# Patient Record
Sex: Female | Born: 1985 | Race: White | Hispanic: No | Marital: Married | Smoking: Current every day smoker
Health system: Southern US, Community
[De-identification: ages and names within clinical notes are randomized; demographics above are authoritative.]

## PROBLEM LIST (undated history)

## (undated) ENCOUNTER — Inpatient Hospital Stay (HOSPITAL_COMMUNITY): Payer: Self-pay

## (undated) DIAGNOSIS — F329 Major depressive disorder, single episode, unspecified: Secondary | ICD-10-CM

## (undated) DIAGNOSIS — F32A Depression, unspecified: Secondary | ICD-10-CM

## (undated) DIAGNOSIS — I493 Ventricular premature depolarization: Secondary | ICD-10-CM

## (undated) DIAGNOSIS — R87619 Unspecified abnormal cytological findings in specimens from cervix uteri: Secondary | ICD-10-CM

## (undated) DIAGNOSIS — E119 Type 2 diabetes mellitus without complications: Secondary | ICD-10-CM

## (undated) DIAGNOSIS — F419 Anxiety disorder, unspecified: Secondary | ICD-10-CM

## (undated) DIAGNOSIS — A549 Gonococcal infection, unspecified: Secondary | ICD-10-CM

## (undated) DIAGNOSIS — K219 Gastro-esophageal reflux disease without esophagitis: Secondary | ICD-10-CM

## (undated) DIAGNOSIS — G43909 Migraine, unspecified, not intractable, without status migrainosus: Secondary | ICD-10-CM

## (undated) DIAGNOSIS — B977 Papillomavirus as the cause of diseases classified elsewhere: Secondary | ICD-10-CM

## (undated) DIAGNOSIS — Z8619 Personal history of other infectious and parasitic diseases: Secondary | ICD-10-CM

## (undated) DIAGNOSIS — IMO0002 Reserved for concepts with insufficient information to code with codable children: Secondary | ICD-10-CM

## (undated) DIAGNOSIS — R51 Headache: Secondary | ICD-10-CM

## (undated) HISTORY — PX: WISDOM TOOTH EXTRACTION: SHX21

## (undated) HISTORY — DX: Personal history of other infectious and parasitic diseases: Z86.19

## (undated) HISTORY — DX: Migraine, unspecified, not intractable, without status migrainosus: G43.909

## (undated) HISTORY — PX: TUBAL LIGATION: SHX77

---
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
## 2006-01-04 ENCOUNTER — Emergency Department (HOSPITAL_COMMUNITY): Admission: EM | Admit: 2006-01-04 | Discharge: 2006-01-04 | Payer: Self-pay | Admitting: Emergency Medicine

## 2006-08-04 ENCOUNTER — Inpatient Hospital Stay (HOSPITAL_COMMUNITY): Admission: AD | Admit: 2006-08-04 | Discharge: 2006-08-04 | Payer: Self-pay | Admitting: Gynecology

## 2006-11-03 ENCOUNTER — Inpatient Hospital Stay (HOSPITAL_COMMUNITY): Admission: AD | Admit: 2006-11-03 | Discharge: 2006-11-03 | Payer: Self-pay | Admitting: Obstetrics and Gynecology

## 2007-01-19 ENCOUNTER — Inpatient Hospital Stay (HOSPITAL_COMMUNITY): Admission: AD | Admit: 2007-01-19 | Discharge: 2007-01-19 | Payer: Self-pay | Admitting: Obstetrics & Gynecology

## 2007-03-27 ENCOUNTER — Inpatient Hospital Stay (HOSPITAL_COMMUNITY): Admission: AD | Admit: 2007-03-27 | Discharge: 2007-03-28 | Payer: Self-pay | Admitting: Obstetrics & Gynecology

## 2007-03-28 ENCOUNTER — Inpatient Hospital Stay (HOSPITAL_COMMUNITY): Admission: AD | Admit: 2007-03-28 | Discharge: 2007-04-01 | Payer: Self-pay | Admitting: Obstetrics and Gynecology

## 2007-03-29 ENCOUNTER — Encounter (INDEPENDENT_AMBULATORY_CARE_PROVIDER_SITE_OTHER): Payer: Self-pay | Admitting: Obstetrics and Gynecology

## 2007-05-18 ENCOUNTER — Emergency Department (HOSPITAL_COMMUNITY): Admission: EM | Admit: 2007-05-18 | Discharge: 2007-05-19 | Payer: Self-pay | Admitting: Emergency Medicine

## 2007-11-03 ENCOUNTER — Inpatient Hospital Stay (HOSPITAL_COMMUNITY): Admission: AD | Admit: 2007-11-03 | Discharge: 2007-11-04 | Payer: Self-pay | Admitting: Obstetrics and Gynecology

## 2007-11-03 ENCOUNTER — Emergency Department (HOSPITAL_COMMUNITY): Admission: EM | Admit: 2007-11-03 | Discharge: 2007-11-03 | Payer: Self-pay | Admitting: Emergency Medicine

## 2007-12-07 ENCOUNTER — Inpatient Hospital Stay (HOSPITAL_COMMUNITY): Admission: AD | Admit: 2007-12-07 | Discharge: 2007-12-07 | Payer: Self-pay | Admitting: Obstetrics & Gynecology

## 2008-02-12 ENCOUNTER — Inpatient Hospital Stay (HOSPITAL_COMMUNITY): Admission: AD | Admit: 2008-02-12 | Discharge: 2008-02-12 | Payer: Self-pay | Admitting: Obstetrics & Gynecology

## 2008-02-28 ENCOUNTER — Inpatient Hospital Stay (HOSPITAL_COMMUNITY): Admission: RE | Admit: 2008-02-28 | Discharge: 2008-03-02 | Payer: Self-pay | Admitting: Obstetrics and Gynecology

## 2008-03-03 ENCOUNTER — Inpatient Hospital Stay (HOSPITAL_COMMUNITY): Admission: AD | Admit: 2008-03-03 | Discharge: 2008-03-03 | Payer: Self-pay | Admitting: Obstetrics and Gynecology

## 2008-04-11 ENCOUNTER — Emergency Department (HOSPITAL_COMMUNITY): Admission: EM | Admit: 2008-04-11 | Discharge: 2008-04-11 | Payer: Self-pay | Admitting: Emergency Medicine

## 2008-05-07 ENCOUNTER — Emergency Department (HOSPITAL_COMMUNITY): Admission: EM | Admit: 2008-05-07 | Discharge: 2008-05-08 | Payer: Self-pay | Admitting: Emergency Medicine

## 2008-05-27 ENCOUNTER — Emergency Department (HOSPITAL_COMMUNITY): Admission: EM | Admit: 2008-05-27 | Discharge: 2008-05-27 | Payer: Self-pay | Admitting: Emergency Medicine

## 2008-10-05 ENCOUNTER — Emergency Department (HOSPITAL_COMMUNITY): Admission: EM | Admit: 2008-10-05 | Discharge: 2008-10-05 | Payer: Self-pay | Admitting: Emergency Medicine

## 2008-12-21 ENCOUNTER — Emergency Department (HOSPITAL_COMMUNITY): Admission: EM | Admit: 2008-12-21 | Discharge: 2008-12-22 | Payer: Self-pay | Admitting: Emergency Medicine

## 2009-02-22 ENCOUNTER — Emergency Department (HOSPITAL_COMMUNITY): Admission: EM | Admit: 2009-02-22 | Discharge: 2009-02-23 | Payer: Self-pay | Admitting: Emergency Medicine

## 2009-03-12 ENCOUNTER — Emergency Department (HOSPITAL_COMMUNITY): Admission: EM | Admit: 2009-03-12 | Discharge: 2009-03-12 | Payer: Self-pay | Admitting: Emergency Medicine

## 2009-06-06 ENCOUNTER — Emergency Department (HOSPITAL_COMMUNITY): Admission: EM | Admit: 2009-06-06 | Discharge: 2009-06-06 | Payer: Self-pay | Admitting: Emergency Medicine

## 2010-04-22 ENCOUNTER — Emergency Department (HOSPITAL_COMMUNITY)
Admission: EM | Admit: 2010-04-22 | Discharge: 2010-04-22 | Payer: Self-pay | Source: Home / Self Care | Admitting: Emergency Medicine

## 2010-11-09 LAB — URINALYSIS, ROUTINE W REFLEX MICROSCOPIC
Bilirubin Urine: NEGATIVE
Glucose, UA: NEGATIVE mg/dL
Ketones, ur: NEGATIVE mg/dL
Leukocytes, UA: NEGATIVE
Nitrite: NEGATIVE
Protein, ur: NEGATIVE mg/dL
Specific Gravity, Urine: 1.014 (ref 1.005–1.030)
Urobilinogen, UA: 0.2 mg/dL (ref 0.0–1.0)
pH: 5.5 (ref 5.0–8.0)

## 2010-11-09 LAB — COMPREHENSIVE METABOLIC PANEL
Albumin: 3.5 g/dL (ref 3.5–5.2)
Alkaline Phosphatase: 56 U/L (ref 39–117)
BUN: 8 mg/dL (ref 6–23)
Calcium: 9.1 mg/dL (ref 8.4–10.5)
Creatinine, Ser: 0.62 mg/dL (ref 0.4–1.2)
Potassium: 3.9 mEq/L (ref 3.5–5.1)
Total Protein: 6.8 g/dL (ref 6.0–8.3)

## 2010-11-09 LAB — URINE MICROSCOPIC-ADD ON

## 2010-11-09 LAB — CBC
HCT: 38.9 % (ref 36.0–46.0)
Platelets: 259 10*3/uL (ref 150–400)
RDW: 12.5 % (ref 11.5–15.5)

## 2010-11-09 LAB — DIFFERENTIAL
Lymphocytes Relative: 38 % (ref 12–46)
Lymphs Abs: 2.6 10*3/uL (ref 0.7–4.0)
Monocytes Absolute: 0.6 10*3/uL (ref 0.1–1.0)
Monocytes Relative: 9 % (ref 3–12)
Neutro Abs: 3.4 10*3/uL (ref 1.7–7.7)

## 2010-11-11 LAB — WOUND CULTURE

## 2010-12-16 NOTE — Op Note (Signed)
[name], [name]              ACCOUNT NO.:  [id number]   MEDICAL RECORD NO.:  [id number]          PATIENT TYPE:  INP   LOCATION:  9126                          FACILITY:  [facility]   PHYSICIAN:  Randye Lobo, M.D.   DATE OF BIRTH:  13-Feb-1986   DATE OF PROCEDURE:  03/29/2007  DATE OF DISCHARGE:                               OPERATIVE REPORT   PREOPERATIVE DIAGNOSIS:  1. Intrauterine gestation at 63 plus 6 weeks.  2. Severe fetal variable decelerations.  3. Group B strep positive status.  4. Tobacco use.   POSTOPERATIVE DIAGNOSIS:  1. Intrauterine gestation at 55 plus 6 weeks.  2. Severe fetal variable decelerations.  3. Positive group B strep status.  4. Tobacco use.   PROCEDURE:  Primary low segment transverse cesarean section.   SURGEON:  Conley Simmonds, M.D.   ANESTHESIA:  Spinal.   IV FLUIDS:  3000 mL Ringer's lactate.   ESTIMATED BLOOD LOSS:  700 mL   URINE OUTPUT:  650 mL   COMPLICATIONS:  None.   INDICATIONS FOR PROCEDURE:  The patient is a 25 year old gravida 1, para  24 Caucasian female at 24 plus [redacted] weeks gestation ([facility] January 28, 2007) who  presented to the maternity admissions with contractions since March 27, 2007.  The patient had been seen in maternity admissions on August 24 at  which time her cervix was 1 cm dilated.  The patient was discharged to  home.  The patient had follow-up in the office on March 28, 2007 at  which time her cervix was 3 cm dilated.  At that time her contractions  were approximately 11 minutes apart.  The patient was sent home with  labor precautions and she returned late in the evening on that same day  to maternity admissions at which time her cervix was noted to be 4 cm  dilated and 80-90% effaced.  The patient was admitted. There was a  question of possible variable decelerations.  The fetal heart rate  tracing did have accelerations and was reactive.  The patient was  admitted and given IV fluids.  The patient did request  Stadol for pain  control.  The fetal heart rate tracing continued to have what appeared  to be moderate variable decelerations although the contractions were not  monitoring well depending on the position of the patient.  I checked the  patient's cervix and found her cervix to be unchanged and I ruptured  membranes at this time and clear fluid was noted.  I placed both an  internal pressure catheter and fetal scalp electrode.  The fetal scalp  electrode was noted to have an incomplete tracing at times which  suggested the possibility of an arrhythmia.  The external Doppler was  therefore used to monitor the fetal heart rate as well.  When the  patient had good quality contractions that she was feeling, the fetus  continued to develop moderate to severe variable decelerations.  A  discussion was held with the patient at this time regarding the fetal  intolerance to labor and the variable decelerations and a plan was  made  to proceed with a cesarean section as the patient was remote from  delivery.  Risks, benefits, and alternatives were discussed with the  patient and her husband who chose to proceed.   FINDINGS:  A viable female was delivered at 6:06 a.m. with Apgars of 8 at  1 and 9 at 5 minutes.  The weight was 6 pounds 7 ounces.  The amniotic  fluid was noted to be clear.  The cord pH was 7.3.  The uterus, tubes  and ovaries were unremarkable.  The placenta had a notable number of  calcifications in the cotyledons.  The placental mass was noted to be  small.  There was a three-vessel cord.   SPECIMENS:  The placenta was sent to pathology.   PROCEDURE:  The patient was taken from her labor and delivery suite down  to the operating room where spinal anesthetic was administered.  The  patient was then placed in the supine position with a left lateral tilt.  The abdomen was sterilely prepped and draped.  A Foley catheter had been  previously placed inside the patient's bladder in her  labor and delivery  suite.   An Allis test was performed and the patient was noted to have adequate  surgical anesthesia.  A Pfannenstiel incision was then created sharply  with a scalpel.  The incision was carried down to the fascia using  monopolar cautery for hemostasis.  The fascia was then incised in the  midline with a scalpel and the incision was extended bilaterally with a  Mayo scissors.  The rectus muscles were separated from the fascia  superiorly and inferiorly using sharp dissection.  The rectus muscles  were then sharply divided in the midline.  The parietal peritoneum was  elevated with two hemostat clamps and was entered sharply.  The  peritoneal incision was extended cranially and caudally.   The lower uterine segment was exposed with the bladder retractor.  The  bladder flap was sharply created.  A transverse lower uterine segment  incision was then created with a scalpel.  The incision was extended  bilaterally in an upward fashion with the bandage scissors.  A hand was  inserted through the uterine incision and the vertex was delivered  without difficulty followed by the remainder of the infant.  The nares  and the mouth of the newborn were suctioned.  The newborn was carried  over to the waiting pediatricians.   A cord pH and cord blood were obtained at this time and the placenta was  then sent to pathology.   The uterus was exteriorized for its closure.  It was wiped clean with a  moistened lap pad.  The uterine incision was then closed with a double  layer closure of #1 chromic.  The first was a running locked layer and  the second was an imbricating layer.  There was some bleeding along the  midportion of the incision which was sutured with two figure-of-eight  sutures of #1 chromic.  At a suture exit site along the upper right  midportion of the incision there was some bleeding and this was  controlled with two figure-of-eight sutures of 3-0 Vicryl  suture.   The uterus was returned to the peritoneal cavity as it had been  exteriorized for its closure.  The peritoneal cavity was irrigated and  suctioned.   The uterine incision was found to be hemostatic and the abdomen was  therefore closed.  The peritoneal layer was closed  with a running suture  of 3-0 Vicryl.  The rectus muscles were reapproximated in the midline  with interrupted sutures of #1 chromic.  The fascia was closed with a  running suture of 0 Vicryl.  The subcutaneous layer was irrigated and  suctioned and made hemostatic with monopolar cautery.  The skin was  closed with staples and a sterile bandage was placed over this.   This concluded the patient's procedure.  There were no complications.  All needle, instrument, sponge counts were correct.      Randye Lobo, M.D.  Electronically Signed     BES/MEDQ  D:  03/29/2007  T:  03/29/2007  Job:  [id number]

## 2010-12-16 NOTE — Op Note (Signed)
[name], [name]              ACCOUNT NO.:  [id number]   MEDICAL RECORD NO.:  [id number]          PATIENT TYPE:  INP   LOCATION:  9109                          FACILITY:  [facility]   PHYSICIAN:  Randye Lobo, M.D.   DATE OF BIRTH:  29-Jun-1986   DATE OF PROCEDURE:  02/28/2008  DATE OF DISCHARGE:                               OPERATIVE REPORT   PREOPERATIVE DIAGNOSES:  1. Intrauterine gestation at 57 plus 2 weeks.  2. History of prior cesarean section, desires repeat cesarean section.   POSTOPERATIVE DIAGNOSES:  1. Intrauterine gestation at 41 plus 2 weeks.  2. History of prior cesarean section, desires repeat cesarean section.  3. Defect in myometrium of the left lower uterine segment.   PROCEDURE:  Repeat low-segment transverse cesarean section.   SURGEON:  Randye Lobo, MD.   ASSISTANTGretchen Short, PA-C   ANESTHESIA:  Spinal.   IV FLUIDS:  2600 mL Ringer lactate.   ESTIMATED BLOOD LOSS:  700 mL.   URINE OUTPUT:  300 mL   COMPLICATIONS:  None.   INDICATIONS FOR THE PROCEDURE:  The patient is a 25 year old gravida 2,  para 1-0-0-1 Caucasian female at 3 plus 2 weeks' gestation by early  ultrasound, with a history of prior cesarean section due to a non-  reassuring fetal assessment, and throughout her current pregnancy has  desired a repeat cesarean section and the avoidance of labor.  The  patient's antepartum course has been significant for a smoking status,  situational depression treated with Lexapro, and a history of cervical  dysplasia which was diagnosed immediately prior to this pregnancy.  A  plan is now made to proceed with a repeat cesarean section after term,  after risks, benefits, and alternatives have reviewed.   FINDINGS:  A viable female was delivered at 8:52 a.m.Marland Kitchen  Apgars were 7 at  1 minute and 8 at 5 minutes.  The weight was 7 pounds 5 ounces.  The  amniotic fluid was noted to be clear.  The newborn did require blow-by  O2 due to grunting.   The LOWER UTERINE SEGMENT was noted to be paper thin, and the LEFT LOWER  UTERINE SEGMENT prior incision area demonstrated a protruding defect and  thin area which was of concern for impending uterine rupture.  There was  no true absence of myometrium in this region, but the area was  concerning and suspicious.  A RECOMMENDATION WILL BE MADE TO AVOID  FUTURE PREGNANCIES DUE TO THE POTENTIAL RISK OF UTERINE RUPTURE.  The tubes and ovaries were unremarkable.   SPECIMENS:  None.   PROCEDURE:  The patient was reidentified in the preoperative hold area.  She received Ancef 1 g IV for antibiotic prophylaxis.   In the operating room, a spinal anesthetic was administered and the  patient was then placed in the supine position.  The abdomen was  sterilely prepped and a Foley catheter was sterilely placed inside the  bladder.  She was sterilely draped.   An Allis test was performed and the patient had adequate surgical  anesthesia.  A Pfannenstiel incision was created  sharply with a scalpel.  The incision was carried down to the fascia using the scalpel.  The  fascia was then incised in the midline with a scalpel and the incision  was extended bilaterally with the Mayo scissors.  The rectus muscles  were dissected off of the fascia superiorly and inferiorly using sharp  dissection.  The rectus muscles were then sharply divided in the  midline.  The parietal peritoneum was elevated with 2 hemostat clamps  and was entered sharply.  The peritoneal incision was extended cranially  and caudally.   The lower uterine segment was exposed with the bladder retractor.  The  findings are as noted above.  The bladder flap was then sharply created.  A transverse lower uterine segment incision was then created sharply  with a scalpel and the incision was extended bilaterally with the  bandage scissors.  A hand was inserted through the uterine incision and  the vertex was delivered without difficulty,  followed by the remainder  of the infant.  The nares and mouth were suctioned and the umbilical  cord was doubly clamped and cut, the newborn was carried over to the  awaiting pediatricians.   The uterus was exteriorized at this time, it was wiped clean with a  moistened lap pad.  The uterine incision was closed with a single layer  closure of #1 chromic.  The area of thin myometrium in the left lower  uterine segment demonstrated a defect as the suture pulled through this  very thin area.  This was repaired secondarily with a figure-of-eight  suture of 2-0 Vicryl.  The visceral peritoneum over the lower uterine  segment incision was then closed to provide an extra layer of  protection.  The uterine incision was hemostatic and the uterus was,  therefore, returned to the peritoneal cavity.   The peritoneal cavity was irrigated and suctioned.  The abdomen was  closed at this time..  The parietal peritoneum was closed with a running  suture of 3-0 Vicryl.  The rectus muscles were reapproximated in the  midline with interrupted sutures of #1 chromic.  The fascia was closed  with a running suture of 0 Vicryl.  The subcutaneous layer was irrigated  and suctioned, it was made hemostatic with monopolar cautery.  The  subcutaneous layer was noted to be oozy despite using the monopolar  cautery, and a decision was therefore made to place a JP drain, which  came out of the skin to the right of the Pfannenstiel incision.  The  subcutaneous layer was closed on top of this with  interrupted sutures of 3-0 plain gut suture and staples were used to  close the skin.  A sterile bandage was placed over the incision and the  JP drain site.   There were no complications.  All needle, instrument, and sponge counts  were correct.      Randye Lobo, M.D.  Electronically Signed     BES/MEDQ  D:  02/28/2008  T:  02/28/2008  Job:  [id number]

## 2010-12-16 NOTE — Discharge Summary (Signed)
NAMEJERRY, [name]              ACCOUNT NO.:  [id number]   MEDICAL RECORD NO.:  [id number]          PATIENT TYPE:  INP   LOCATION:  9126                          FACILITY:  [facility]   PHYSICIAN:  Carrington Clamp, M.D. DATE OF BIRTH:  1986-02-02   DATE OF ADMISSION:  03/28/2007  DATE OF DISCHARGE:  04/01/2007                               DISCHARGE SUMMARY   DISCHARGE DIAGNOSES:  1. Intrauterine pregnancy at 39-6/7 weeks' gestation.  2. Severe fetal variable decelerations.  3. Positive Group B Streptococcus.  4. Tobacco dependence.   PROCEDURE:  Primary low segment transverse cesarean section, surgeon Dr.  Conley Simmonds.  Complications none.   HISTORY OF PRESENT ILLNESS:  This 25 year old, G1, P0 presents to  maternity admissions at 37-6/7 weeks' gestation in early labor.  The  patient's antepartum course up to this point has been complicated by a  history of tobacco dependence.  The patient has been counseled on  cessation.  The patient otherwise has had a normal antepartum course.  She did have a positive Group B Streptococcus culture obtained in the  office at 36 weeks.  When the patient presented to [facility], the  patient was already 4 cm dilated and 80-90% effaced.  She was admitted  at this time.   HOSPITAL COURSE:  During her hospital course, there was a possible  variable deceleration noted.  There were some accelerations and it was  reactive still at this point.  The patient was started on IV fluids and  was given Stadol for pain control.  The fetal tracing continued to have  these moderate variable decelerations.  The patient's cervix was still  unchanged and AROM was performed at this point.  Internal pressure  catheters and fetal scalp electrode was placed.  Incomplete tracings  were noted with suggestion of a questionable arrhythmia.  There  was an  external Doppler used as well.  At this point, the patient continued to  have these moderate to severe variable  deceleration.  Discussion was  held with the patient at this point and decision was made to proceed  with a cesarean section secondary to fetal intolerance of labor.  The  patient was taken to the operating room by Dr. Conley Simmonds where a  primary low transverse cesarean section was performed with the delivery  of a 6 pound 7 ounce, female infant with Apgar's 8 and 9 performed.  Delivery went without complications.  The patient's postoperative course  was benign without any significant fevers.  She did want her little boy  circumcised before discharge.  She was started on some iron secondary to  some postoperative anemia.  The patient was felt ready for discharge on  postop day #3.   DIET:  She was sent home on a regular diet.   ACTIVITY:  She was told to decrease activities.   DISCHARGE MEDICATIONS:  Continue her vitamins and her iron supplement  daily.  She was given Percocet one to two every 4-6 hours as needed for  pain.   FOLLOW UP:  She was to follow up in our office in 4  weeks.   SPECIAL INSTRUCTIONS:  Instructions and precautions were reviewed with  the patient.   DISCHARGE LABORATORY DATA AND X-RAY FINDINGS:  The patient had a  hemoglobin of 9.4, white blood cell count of 8.8 and platelets of  231,000.      Leilani Able, P.A.-C.      Carrington Clamp, M.D.  Electronically Signed    MB/MEDQ  D:  05/13/2007  T:  05/14/2007  Job:  [id number]

## 2010-12-19 NOTE — Discharge Summary (Signed)
[name], REGISTER              ACCOUNT NO.:  [id number]   MEDICAL RECORD NO.:  [id number]          PATIENT TYPE:  INP   LOCATION:  9109                          FACILITY:  [facility]   PHYSICIAN:  Carrington Clamp, M.D. DATE OF BIRTH:  06-21-1986   DATE OF ADMISSION:  02/28/2008  DATE OF DISCHARGE:  03/02/2008                               DISCHARGE SUMMARY   FINAL DIAGNOSES:  1. Intrauterine gestation at 19 and 2/7 weeks.  2. History of prior cesarean section.  3. The patient desires repeat cesarean section.  4. Defect in the myometrium of the left lower uterine segment.   PROCEDURE:  Repeat low segment transverse cesarean section.   SURGEON:  Randye Lobo, MD   ASSISTANT:  Chinita Greenland, PA-C   COMPLICATIONS:  None.   This 25 year old G2, P1-0-0-1 presents at term for repeat cesarean  section.  The patient had a prior cesarean section with her last  pregnancy secondary to a nonreassuring fetal assessment and decision had  been made to have a repeat with this pregnancy as well.  The patient's  antepartum course had been complicated by tobacco dependence,  situational depression, which the patient was on Lexapro for throughout  her pregnancy and history of cervical dysplasia that was followed  throughout the pregnancy.  Otherwise, the patient's antepartum course up  to this point had been otherwise uncomplicated.  She presents now at  term for repeat cesarean section.  She was taken to the operating room  by Dr. Conley Simmonds where a repeat low transverse cesarean section was  performed with the delivery of a 7 pounds, 5 ounces female infant with  Apgars of 7 and 8.  The delivery went without complications.  The  newborn did require blow-by oxygen due to some grunting, but went to  recovery stable.  Upon delivery, the lower uterine segment of the  patient's uterus was noted to be paper thin with a protruding defect in  thin area, which was concerned for impending uterine  rupture.  Recommendation will be made to the patient regarding avoiding future  pregnancies secondary to this risk of rupture.  The patient's  postoperative course was benign without any significant fevers.  She did  have a JP drain that was placed secondary to some extra drainage.  Dr.  Edward Jolly reviewed the patient's findings as far as the myometrial defects  with the patient and her recommendation of no future pregnancies.  Otherwise, the patient's postoperative course was benign without any  significant fevers.  The patient was felt ready for discharge on  postoperative day #3.  She was sent home on a regular diet.  Told to  decrease activities.  Told to continue her vitamins.  Was given a  prescription for Percocet 1-2 every 4-6 hours as needed for her pain.  Was told she could also use ibuprofen up to 600 mg every 6 hours as  needed for pain.  She did receive Depo-Provera prior to discharge for  birth control.  Was to follow up in our office in 4 weeks.  Instructions  and precautions were reviewed with  the patient.   LABS ON DISCHARGE:  The patient had a hemoglobin of 9.1, white blood  cell count of 7.8, and platelets of 227,000.      Leilani Able, P.A.-C.      Carrington Clamp, M.D.  Electronically Signed    MB/MEDQ  D:  03/27/2008  T:  03/28/2008  Job:  [id number]

## 2010-12-23 ENCOUNTER — Emergency Department (HOSPITAL_COMMUNITY)
Admission: EM | Admit: 2010-12-23 | Discharge: 2010-12-24 | Disposition: A | Discharge status: Home / Self Care | Payer: BC Managed Care – PPO | Attending: Emergency Medicine | Admitting: Emergency Medicine

## 2010-12-23 DIAGNOSIS — F3289 Other specified depressive episodes: Secondary | ICD-10-CM | POA: Insufficient documentation

## 2010-12-23 DIAGNOSIS — R51 Headache: Secondary | ICD-10-CM | POA: Insufficient documentation

## 2010-12-23 DIAGNOSIS — J329 Chronic sinusitis, unspecified: Secondary | ICD-10-CM | POA: Insufficient documentation

## 2010-12-23 DIAGNOSIS — J3489 Other specified disorders of nose and nasal sinuses: Secondary | ICD-10-CM | POA: Insufficient documentation

## 2010-12-23 DIAGNOSIS — F329 Major depressive disorder, single episode, unspecified: Secondary | ICD-10-CM | POA: Insufficient documentation

## 2010-12-24 ENCOUNTER — Emergency Department (HOSPITAL_COMMUNITY)
Admission: EM | Admit: 2010-12-24 | Discharge: 2010-12-24 | Disposition: A | Discharge status: Home / Self Care | Payer: BC Managed Care – PPO | Attending: Emergency Medicine | Admitting: Emergency Medicine

## 2010-12-24 DIAGNOSIS — R51 Headache: Secondary | ICD-10-CM | POA: Insufficient documentation

## 2010-12-24 DIAGNOSIS — F3289 Other specified depressive episodes: Secondary | ICD-10-CM | POA: Insufficient documentation

## 2010-12-24 DIAGNOSIS — F329 Major depressive disorder, single episode, unspecified: Secondary | ICD-10-CM | POA: Insufficient documentation

## 2010-12-24 DIAGNOSIS — R112 Nausea with vomiting, unspecified: Secondary | ICD-10-CM | POA: Insufficient documentation

## 2010-12-24 LAB — POCT PREGNANCY, URINE: Preg Test, Ur: NEGATIVE

## 2011-02-13 ENCOUNTER — Other Ambulatory Visit: Payer: Self-pay | Admitting: Obstetrics and Gynecology

## 2011-04-30 LAB — URINALYSIS, ROUTINE W REFLEX MICROSCOPIC
Bilirubin Urine: NEGATIVE
Hgb urine dipstick: NEGATIVE
Ketones, ur: NEGATIVE
Nitrite: NEGATIVE
Urobilinogen, UA: 0.2

## 2011-05-01 LAB — COMPREHENSIVE METABOLIC PANEL
ALT: 18
AST: 17
Alkaline Phosphatase: 89
CO2: 23
Calcium: 8.3 — ABNORMAL LOW
Chloride: 105
GFR calc Af Amer: >60
GFR calc non Af Amer: >60
Glucose, Bld: 91
Potassium: 3.4 — ABNORMAL LOW
Sodium: 137
Total Bilirubin: 0.6

## 2011-05-01 LAB — CBC
HCT: 28.9 — ABNORMAL LOW
Hemoglobin: 12.8
Hemoglobin: 9.1 — ABNORMAL LOW
Hemoglobin: 9.9 — ABNORMAL LOW
Hemoglobin: 8.8 — ABNORMAL LOW
MCHC: 33.7
MCHC: 33.7
MCHC: 34.2
MCHC: 34.5
MCV: 95.5
Platelets: 227
Platelets: 239
RBC: 2.73 — ABNORMAL LOW
RDW: 14.4
RDW: 14.4
RDW: 14.5
WBC: 6.5

## 2011-05-01 LAB — RPR: RPR Ser Ql: NONREACTIVE

## 2011-05-01 LAB — URINALYSIS, ROUTINE W REFLEX MICROSCOPIC
Bilirubin Urine: NEGATIVE
Glucose, UA: NEGATIVE
Glucose, UA: NEGATIVE
Ketones, ur: NEGATIVE
Ketones, ur: NEGATIVE
Leukocytes, UA: NEGATIVE
Protein, ur: NEGATIVE
Protein, ur: 30 — AB
Urobilinogen, UA: 0.2

## 2011-05-01 LAB — DIFFERENTIAL
Basophils Relative: 0
Eosinophils Absolute: 0.2
Eosinophils Relative: 3
Lymphs Abs: 0.7
Neutrophils Relative %: 82 — ABNORMAL HIGH

## 2011-05-01 LAB — URINE MICROSCOPIC-ADD ON

## 2011-05-05 LAB — URINALYSIS, ROUTINE W REFLEX MICROSCOPIC
Bilirubin Urine: NEGATIVE
Ketones, ur: NEGATIVE
Nitrite: NEGATIVE
Protein, ur: NEGATIVE
Urobilinogen, UA: 1
pH: 6

## 2011-05-05 LAB — COMPREHENSIVE METABOLIC PANEL
ALT: 39 — ABNORMAL HIGH
AST: 24
Alkaline Phosphatase: 68
CO2: 23
Chloride: 109
GFR calc Af Amer: >60
GFR calc non Af Amer: >60
Glucose, Bld: 92
Sodium: 141
Total Bilirubin: 0.5

## 2011-05-05 LAB — DIFFERENTIAL
Basophils Absolute: 0
Basophils Relative: 0
Eosinophils Absolute: 0.3
Eosinophils Relative: 5
Neutrophils Relative %: 51

## 2011-05-05 LAB — LIPASE, BLOOD: Lipase: 25

## 2011-05-05 LAB — CBC
Hemoglobin: 13.1
RBC: 4.32
WBC: 6.7

## 2011-05-05 LAB — OCCULT BLOOD X 1 CARD TO LAB, STOOL: Fecal Occult Bld: NEGATIVE

## 2011-05-06 LAB — DIFFERENTIAL
Basophils Absolute: 0.1
Eosinophils Relative: 2
Lymphocytes Relative: 16
Lymphs Abs: 1.6
Monocytes Absolute: 0.5
Neutro Abs: 7.9 — ABNORMAL HIGH

## 2011-05-06 LAB — COMPREHENSIVE METABOLIC PANEL
AST: 25
Albumin: 3.8
BUN: 9
Calcium: 9.6
Chloride: 113 — ABNORMAL HIGH
Creatinine, Ser: 0.77
GFR calc Af Amer: >60
Total Bilirubin: 0.6
Total Protein: 6.5

## 2011-05-06 LAB — CBC
Hemoglobin: 12.4
MCHC: 33.1
MCV: 91
Platelets: 321
RBC: 4.1
RDW: 14.2
WBC: 10.3

## 2011-05-06 LAB — URINALYSIS, ROUTINE W REFLEX MICROSCOPIC
Bilirubin Urine: NEGATIVE
Glucose, UA: NEGATIVE
Nitrite: NEGATIVE
Specific Gravity, Urine: 1.019
pH: 6

## 2011-05-06 LAB — URINE MICROSCOPIC-ADD ON

## 2011-05-08 ENCOUNTER — Encounter (INDEPENDENT_AMBULATORY_CARE_PROVIDER_SITE_OTHER): Payer: BC Managed Care – PPO | Admitting: Ophthalmology

## 2011-05-08 DIAGNOSIS — H43819 Vitreous degeneration, unspecified eye: Secondary | ICD-10-CM

## 2011-05-08 DIAGNOSIS — H47329 Drusen of optic disc, unspecified eye: Secondary | ICD-10-CM

## 2011-05-15 LAB — URINALYSIS, ROUTINE W REFLEX MICROSCOPIC
Bilirubin Urine: NEGATIVE
Hgb urine dipstick: NEGATIVE
Nitrite: NEGATIVE
Protein, ur: NEGATIVE
Urobilinogen, UA: 1

## 2011-05-15 LAB — CBC
MCHC: 35
MCV: 91.4
Platelets: 231
Platelets: 309
RDW: 14.3 — ABNORMAL HIGH
RDW: 14.6 — ABNORMAL HIGH

## 2011-05-15 LAB — RPR: RPR Ser Ql: NONREACTIVE

## 2011-05-20 LAB — URINALYSIS, ROUTINE W REFLEX MICROSCOPIC
Bilirubin Urine: NEGATIVE
Nitrite: NEGATIVE
Specific Gravity, Urine: 1.01
Urobilinogen, UA: 0.2

## 2011-08-05 ENCOUNTER — Inpatient Hospital Stay (HOSPITAL_COMMUNITY)
Admission: AD | Admit: 2011-08-05 | Discharge: 2011-08-05 | Disposition: A | Discharge status: Home / Self Care | Payer: BC Managed Care – PPO | Source: Ambulatory Visit | Attending: Obstetrics and Gynecology | Admitting: Obstetrics and Gynecology

## 2011-08-05 ENCOUNTER — Inpatient Hospital Stay (HOSPITAL_COMMUNITY): Payer: BC Managed Care – PPO

## 2011-08-05 ENCOUNTER — Encounter (HOSPITAL_COMMUNITY): Payer: Self-pay | Admitting: *Deleted

## 2011-08-05 DIAGNOSIS — R109 Unspecified abdominal pain: Secondary | ICD-10-CM | POA: Insufficient documentation

## 2011-08-05 DIAGNOSIS — Z3201 Encounter for pregnancy test, result positive: Secondary | ICD-10-CM

## 2011-08-05 DIAGNOSIS — O99891 Other specified diseases and conditions complicating pregnancy: Secondary | ICD-10-CM | POA: Insufficient documentation

## 2011-08-05 LAB — WET PREP, GENITAL
Clue Cells Wet Prep HPF POC: NONE SEEN
Yeast Wet Prep HPF POC: NONE SEEN

## 2011-08-05 LAB — CBC
HCT: 36.2 % (ref 36.0–46.0)
MCV: 90.3 fL (ref 78.0–100.0)
RDW: 13.2 % (ref 11.5–15.5)
WBC: 8.1 10*3/uL (ref 4.0–10.5)

## 2011-08-05 LAB — URINALYSIS, ROUTINE W REFLEX MICROSCOPIC
Protein, ur: NEGATIVE mg/dL
Urobilinogen, UA: 0.2 mg/dL (ref 0.0–1.0)

## 2011-08-05 LAB — POCT PREGNANCY, URINE: Preg Test, Ur: POSITIVE

## 2011-08-05 NOTE — Progress Notes (Signed)
Patient state she had two positive home pregnancy test last night. States she has had two previous cesarean sections 11 months apart. Was a scheduled repeat with her last pregnancy and was told at delivery that the uterine wall was so thin that the baby's foot was almost pushing through it. Patient states she has had some abdominal cramping and is very concerned about this pregnancy. States she was encouraged not to become pregnant again but her birth control choice made her have headaches and she stopped taking.

## 2011-08-05 NOTE — ED Provider Notes (Signed)
History     Chief Complaint  Patient presents with  . Abdominal Pain   HPI [name] 26 y.o. 5w 2d gestation.  stopped taking birth control pills due to nausea and is now pregnant.  Has an appointment in the office later this month.  Has had 2 c/s with previous children now 78 and 22 years of age.  Was told last pregnancy that the uterine wall was very thin.  Advised not to get pregnant again.  With this pregnancy is now very worried and thought she should be evaluated today.  Having some lower abdominal pain.    OB History    Grav Para Term Preterm Abortions TAB SAB Ect Mult Living   2               Past Medical History  Diagnosis Date  . No pertinent past medical history     Past Surgical History  Procedure Date  . Cesarean section     Family History  Problem Relation Age of Onset  . Heart disease Mother   . Heart disease Father   . Hypertension Father     History  Substance Use Topics  . Smoking status: Not on file  . Smokeless tobacco: Not on file  . Alcohol Use: Not on file    Allergies: No Known Allergies  Prescriptions prior to admission  Medication Sig Dispense Refill  . clindamycin (CLINDAGEL) 1 % gel Apply 1 application topically daily as needed. For skin irritaiton       . ibuprofen (ADVIL,MOTRIN) 200 MG tablet Take 800 mg by mouth every 6 (six) hours as needed. For pain/headaches       . PARoxetine (PAXIL) 20 MG tablet Take 20 mg by mouth every morning.        . topiramate (TOPAMAX) 100 MG tablet Take 100 mg by mouth daily.          Review of Systems  Gastrointestinal: Positive for abdominal pain. Negative for nausea and vomiting.  Genitourinary: Negative for dysuria.   Physical Exam   Blood pressure 156/75, pulse 102, resp. rate 20, height 5' 4.5" (1.638 m), weight 209 lb 12.8 oz (95.165 kg), last menstrual period 06/29/2011, SpO2 98.00%.  Physical Exam  Nursing note and vitals reviewed. Constitutional: She is oriented to person, place,  and time. She appears well-developed and well-nourished.  HENT:  Head: Normocephalic.  Eyes: EOM are normal.  Neck: Neck supple.  GI: Soft. There is tenderness. There is no rebound and no guarding.       Very mild diffuse lower abdominal tenderness  Genitourinary:       Speculum exam: Vulva - pink, dried discharge seen in folds of tissue Vagina - Small amount of creamy discharge, no odor Cervix - No contact bleeding Bimanual exam: Cervix closed Uterus Unable to size due to habitus, mildly tender Adnexa Exam limited due to habitus GC/Chlam, wet prep done Chaperone present for exam.  Musculoskeletal: Normal range of motion.  Neurological: She is alert and oriented to person, place, and time.  Skin: Skin is warm and dry.  Psychiatric: She has a normal mood and affect.    MAU Course  Procedures  MDM Results for orders placed during the hospital encounter of 08/05/11 (from the past 24 hour(s))  URINALYSIS, ROUTINE W REFLEX MICROSCOPIC     Status: Abnormal   Collection Time   08/05/11  5:00 PM      Component Value Range   Color, Urine YELLOW  YELLOW  APPearance CLOUDY (*) CLEAR    Specific Gravity, Urine 1.020  1.005 - 1.030    pH 7.5  5.0 - 8.0    Glucose, UA NEGATIVE  NEGATIVE (mg/dL)   Hgb urine dipstick SMALL (*) NEGATIVE    Bilirubin Urine NEGATIVE  NEGATIVE    Ketones, ur NEGATIVE  NEGATIVE (mg/dL)   Protein, ur NEGATIVE  NEGATIVE (mg/dL)   Urobilinogen, UA 0.2  0.0 - 1.0 (mg/dL)   Nitrite NEGATIVE  NEGATIVE    Leukocytes, UA NEGATIVE  NEGATIVE   URINE MICROSCOPIC-ADD ON     Status: Abnormal   Collection Time   08/05/11  5:00 PM      Component Value Range   Squamous Epithelial / LPF MANY (*) RARE    Urine-Other AMORPHOUS URATES/PHOSPHATES    POCT PREGNANCY, URINE     Status: Normal   Collection Time   08/05/11  5:32 PM      Component Value Range   Preg Test, Ur POSITIVE    WET PREP, GENITAL     Status: Abnormal   Collection Time   08/05/11  5:55 PM      Component  Value Range   Yeast, Wet Prep NONE SEEN  NONE SEEN    Trich, Wet Prep NONE SEEN  NONE SEEN    Clue Cells, Wet Prep NONE SEEN  NONE SEEN    WBC, Wet Prep HPF POC FEW (*) NONE SEEN   ABO/RH     Status: Normal   Collection Time   08/05/11  6:00 PM      Component Value Range   ABO/RH(D) O POS    CBC     Status: Normal   Collection Time   08/05/11  6:00 PM      Component Value Range   WBC 8.1  4.0 - 10.5 (K/uL)   RBC 4.01  3.87 - 5.11 (MIL/uL)   Hemoglobin 12.3  12.0 - 15.0 (g/dL)   HCT 04.5  40.9 - 81.1 (%)   MCV 90.3  78.0 - 100.0 (fL)   MCH 30.7  26.0 - 34.0 (pg)   MCHC 34.0  30.0 - 36.0 (g/dL)   RDW 91.4  78.2 - 95.6 (%)   Platelets 271  150 - 400 (K/uL)  HCG, QUANTITATIVE, PREGNANCY     Status: Abnormal   Collection Time   08/05/11  6:00 PM      Component Value Range   hCG, Beta Chain, Quant, S 3887 (*) <5 (mIU/mL)   Care assumed by Henrietta Hoover PA at 1952  Assessment and Plan    BURLESON,TERRI 08/05/2011, 7:09 PM   Nolene Bernheim, NP 08/05/11 1952  I assumed care of this pt from Nolene Bernheim, FNP.  US Ob Comp Less 14 Wks  08/05/2011  *RADIOLOGY REPORT*  Clinical Data: Abdomen pain.  5 weeks and 2 days pregnant by last menstrual period.  Quantitative beta HCG 3887.  OBSTETRIC <14 WK ULTRASOUND  Technique:  Transabdominal ultrasound was performed for evaluation of the gestation as well as the maternal uterus and adnexal regions.  Comparison:  08/04/2006.  Intrauterine gestational sac: Visualized/normal in shape. Yolk sac: Possible developing yolk sac with no well-defined yolk sac seen at this time. Embryo: Not visualized. Cardiac Activity: Not visualized.  MSD: 7.7 mm  5w  3d           [place] EDC: 04/04/2012  Maternal uterus/Adnexae: Normal appearing maternal ovaries with bilateral follicles/corpus luteum cyst.  Small subchorionic hemorrhage.  No free peritoneal fluid.  IMPRESSION:  1.  Intrauterine gestational sac with an estimated gestational age of [redacted] weeks and 3 days.  No fetal pole  is visible at this time. Differential considerations include normal early intrauterine pregnancy and early fetal demise.  A follow-up obstetrical ultrasound is recommended in 2 weeks. 2.  Small subchorionic hemorrhage.  Original Report Authenticated By: Darrol Angel, M.D.   US Ob Transvaginal  08/05/2011  *RADIOLOGY REPORT*  Clinical Data: Abdomen pain.  5 weeks and 2 days pregnant by last menstrual period.  Quantitative beta HCG 3887.  OBSTETRIC <14 WK ULTRASOUND  Technique:  Transabdominal ultrasound was performed for evaluation of the gestation as well as the maternal uterus and adnexal regions.  Comparison:  08/04/2006.  Intrauterine gestational sac: Visualized/normal in shape. Yolk sac: Possible developing yolk sac with no well-defined yolk sac seen at this time. Embryo: Not visualized. Cardiac Activity: Not visualized.  MSD: 7.7 mm  5w  3d           [place] EDC: 04/04/2012  Maternal uterus/Adnexae: Normal appearing maternal ovaries with bilateral follicles/corpus luteum cyst.  Small subchorionic hemorrhage.  No free peritoneal fluid.  IMPRESSION:  1.  Intrauterine gestational sac with an estimated gestational age of [redacted] weeks and 3 days.  No fetal pole is visible at this time. Differential considerations include normal early intrauterine pregnancy and early fetal demise.  A follow-up obstetrical ultrasound is recommended in 2 weeks. 2.  Small subchorionic hemorrhage.  Original Report Authenticated By: Darrol Angel, M.D.   Discussed findings with pt at length. She has f/u with Dr. Tenny Craw on Tuesday of next week. She will keep this appt. Discussed diet, activity, risks, and precautions.  Clinton Gallant. Rice III, DrHSc, MPAS, PA-C   Henrietta Hoover, [place] 08/05/11 [phone]

## 2011-08-05 NOTE — Progress Notes (Signed)
2 positive pregnancy test. Pt states she is having pain in the lower part of her stomach

## 2011-08-06 LAB — GC/CHLAMYDIA PROBE AMP, GENITAL: Chlamydia, DNA Probe: NEGATIVE

## 2011-08-18 LAB — OB RESULTS CONSOLE ABO/RH

## 2011-08-18 LAB — OB RESULTS CONSOLE RPR: RPR: NONREACTIVE

## 2011-08-18 LAB — OB RESULTS CONSOLE ANTIBODY SCREEN: Antibody Screen: NEGATIVE

## 2011-08-28 ENCOUNTER — Encounter (HOSPITAL_COMMUNITY): Payer: Self-pay | Admitting: *Deleted

## 2011-08-28 ENCOUNTER — Inpatient Hospital Stay (HOSPITAL_COMMUNITY)
Admission: AD | Admit: 2011-08-28 | Discharge: 2011-08-28 | Disposition: A | Discharge status: Home / Self Care | Payer: BC Managed Care – PPO | Source: Ambulatory Visit | Attending: Obstetrics and Gynecology | Admitting: Obstetrics and Gynecology

## 2011-08-28 DIAGNOSIS — G43909 Migraine, unspecified, not intractable, without status migrainosus: Secondary | ICD-10-CM | POA: Insufficient documentation

## 2011-08-28 DIAGNOSIS — R51 Headache: Secondary | ICD-10-CM

## 2011-08-28 DIAGNOSIS — O26899 Other specified pregnancy related conditions, unspecified trimester: Secondary | ICD-10-CM

## 2011-08-28 DIAGNOSIS — O99891 Other specified diseases and conditions complicating pregnancy: Secondary | ICD-10-CM | POA: Insufficient documentation

## 2011-08-28 DIAGNOSIS — R519 Headache, unspecified: Secondary | ICD-10-CM

## 2011-08-28 HISTORY — DX: Headache: R51

## 2011-08-28 HISTORY — DX: Anxiety disorder, unspecified: F41.9

## 2011-08-28 HISTORY — DX: Depression, unspecified: F32.A

## 2011-08-28 HISTORY — DX: Major depressive disorder, single episode, unspecified: F32.9

## 2011-08-28 LAB — URINALYSIS, ROUTINE W REFLEX MICROSCOPIC
Bilirubin Urine: NEGATIVE
Ketones, ur: 15 mg/dL — AB
Nitrite: NEGATIVE
Protein, ur: NEGATIVE mg/dL
Urobilinogen, UA: 0.2 mg/dL (ref 0.0–1.0)

## 2011-08-28 LAB — URINE MICROSCOPIC-ADD ON

## 2011-08-28 MED ORDER — DEXAMETHASONE SODIUM PHOSPHATE 10 MG/ML IJ SOLN
10.0000 mg | Freq: Once | INTRAMUSCULAR | Status: AC
  Administered 2011-08-28: 10 mg via INTRAVENOUS
  Filled 2011-08-28: qty 1

## 2011-08-28 MED ORDER — METOCLOPRAMIDE HCL 5 MG/ML IJ SOLN
10.0000 mg | Freq: Once | INTRAMUSCULAR | Status: AC
  Administered 2011-08-28: 10 mg via INTRAVENOUS
  Filled 2011-08-28: qty 2

## 2011-08-28 MED ORDER — LACTATED RINGERS IV BOLUS (SEPSIS)
1000.0000 mL | Freq: Once | INTRAVENOUS | Status: AC
  Administered 2011-08-28: 1000 mL via INTRAVENOUS

## 2011-08-28 MED ORDER — PROMETHAZINE HCL 25 MG/ML IJ SOLN
12.5000 mg | Freq: Once | INTRAMUSCULAR | Status: AC
  Administered 2011-08-28: 12.5 mg via INTRAVENOUS
  Filled 2011-08-28: qty 1

## 2011-08-28 NOTE — Progress Notes (Signed)
Written and verbal d/c instructions given and understanding voiced. 

## 2011-08-28 NOTE — Progress Notes (Signed)
G3P2 at 9wks. :"I woke up about 0730 with h/a and have had all day. I took Tylenol and Sumatriptan but it didn't help" Also having nausea due to h/a and dizziness.

## 2011-08-28 NOTE — ED Notes (Signed)
Heat packs to hands to help with IV start 

## 2011-08-28 NOTE — ED Provider Notes (Signed)
History     Chief Complaint  Patient presents with  . Headache  . Morning Sickness  . Dizziness   HPI 26 y.o. G3P2000 at [redacted]w[redacted]d c/o migraine headache with nausea, dizziness and aura since 0730 AM. Took Tylenol 1000 mg at 8 AM and 4 PM and Imitrex 50 mg at 4 PM with no relief. Has h/o migraines, this headache is consistent with her prior migraines.    Past Medical History  Diagnosis Date  . No pertinent past medical history   . Depression   . Anxiety   . Headache     Past Surgical History  Procedure Date  . Cesarean section     Family History  Problem Relation Age of Onset  . Heart disease Mother   . Heart disease Father   . Hypertension Father   . Anesthesia problems Neg Hx   . Hypotension Neg Hx   . Malignant hyperthermia Neg Hx   . Pseudochol deficiency Neg Hx     History  Substance Use Topics  . Smoking status: Current Some Day Smoker  . Smokeless tobacco: Not on file  . Alcohol Use: No    Allergies: No Known Allergies  Prescriptions prior to admission  Medication Sig Dispense Refill  . clindamycin (CLINDAGEL) 1 % gel Apply 1 application topically daily as needed. For skin irritaiton       . ibuprofen (ADVIL,MOTRIN) 200 MG tablet Take 800 mg by mouth every 6 (six) hours as needed. For pain/headaches       . PARoxetine (PAXIL) 20 MG tablet Take 20 mg by mouth every morning.        . topiramate (TOPAMAX) 100 MG tablet Take 100 mg by mouth daily.          Review of Systems  Constitutional: Negative.  Negative for fever.  Eyes:       + "seeing lights"  Respiratory: Negative.   Cardiovascular: Negative.   Gastrointestinal: Positive for nausea. Negative for vomiting, abdominal pain, diarrhea and constipation.  Genitourinary: Negative for dysuria, urgency, frequency, hematuria and flank pain.       Negative for vaginal bleeding, vaginal discharge  Musculoskeletal: Negative.   Neurological: Positive for dizziness and headaches. Negative for sensory change,  speech change, focal weakness, seizures and loss of consciousness.  Psychiatric/Behavioral: Negative.    Physical Exam   Blood pressure 137/68, pulse 80, temperature 97.4 F (36.3 C), temperature source Oral, resp. rate 20, height 5\' 3"  (1.6 m), weight 209 lb 12.8 oz (95.165 kg), last menstrual period 06/29/2011.  Physical Exam  Nursing note and vitals reviewed. Constitutional: She is oriented to person, place, and time. She appears well-developed and well-nourished. No distress.  HENT:  Head: Normocephalic and atraumatic.  Neck: Normal range of motion.  Cardiovascular: Normal rate.   Respiratory: Effort normal.  Musculoskeletal: Normal range of motion.  Neurological: She is alert and oriented to person, place, and time. No cranial nerve deficit. Coordination normal.  Skin: Skin is warm and dry.  Psychiatric: She has a normal mood and affect.    MAU Course  Procedures Results for orders placed during the hospital encounter of 08/28/11 (from the past 24 hour(s))  URINALYSIS, ROUTINE W REFLEX MICROSCOPIC     Status: Abnormal   Collection Time   08/28/11  8:00 PM      Component Value Range   Color, Urine YELLOW  YELLOW    APPearance CLEAR  CLEAR    Specific Gravity, Urine 1.020  1.005 - 1.030  pH 6.0  5.0 - 8.0    Glucose, UA NEGATIVE  NEGATIVE (mg/dL)   Hgb urine dipstick TRACE (*) NEGATIVE    Bilirubin Urine NEGATIVE  NEGATIVE    Ketones, ur 15 (*) NEGATIVE (mg/dL)   Protein, ur NEGATIVE  NEGATIVE (mg/dL)   Urobilinogen, UA 0.2  0.0 - 1.0 (mg/dL)   Nitrite NEGATIVE  NEGATIVE    Leukocytes, UA NEGATIVE  NEGATIVE   URINE MICROSCOPIC-ADD ON     Status: Abnormal   Collection Time   08/28/11  8:00 PM      Component Value Range   Squamous Epithelial / LPF FEW (*) RARE    WBC, UA 0-2  <3 (WBC/hpf)   RBC / HPF 3-6  <3 (RBC/hpf)   Bacteria, UA RARE  RARE     IV hydration, Decadron 10 mg, Phenergan 12.5 mg, and Reglan 10 mg IV given in MAU. Headache and nausea improved.    Assessment and Plan  26 y.o. G3P2000 at [redacted]w[redacted]d Migraine Rev'd home treatment for migraine and precautions F/U in office as scheduled or PRN  FRAZIER,NATALIE 08/28/2011, 8:16 PM

## 2011-09-02 NOTE — ED Provider Notes (Signed)
ok 

## 2011-09-05 ENCOUNTER — Inpatient Hospital Stay (HOSPITAL_COMMUNITY): Payer: BC Managed Care – PPO

## 2011-09-05 ENCOUNTER — Encounter (HOSPITAL_COMMUNITY): Payer: Self-pay | Admitting: *Deleted

## 2011-09-05 ENCOUNTER — Inpatient Hospital Stay (HOSPITAL_COMMUNITY)
Admission: AD | Admit: 2011-09-05 | Discharge: 2011-09-05 | Disposition: A | Discharge status: Home / Self Care | Payer: BC Managed Care – PPO | Source: Ambulatory Visit | Attending: Obstetrics and Gynecology | Admitting: Obstetrics and Gynecology

## 2011-09-05 DIAGNOSIS — O209 Hemorrhage in early pregnancy, unspecified: Secondary | ICD-10-CM

## 2011-09-05 NOTE — Progress Notes (Signed)
Pt states, " At around 12:00 am, my husband and I were having sex and I started bleeding. There was a grapefruit size puddle on the bed and when I went to the bathroom there was a lot of blood in the tolite. I had been cramping all evening and I thought it was gas."

## 2011-09-05 NOTE — Progress Notes (Signed)
Lilyan Punt NP in to see pt. Spec exam done. No specimens. Bimanual. Pt tol well.

## 2011-09-05 NOTE — ED Provider Notes (Signed)
History     Chief Complaint  Patient presents with  . Vaginal Bleeding   HPI [name] 26 y.o. 9w 3d gestation.  Had intercourse at 12 MN and then had a small puddle of blood in the bed and in the toilet at home.  Came for evaluation.  Has started prenatal care in the office and has another appointment on Feb 13.   OB History    Grav Para Term Preterm Abortions TAB SAB Ect Mult Living   3 2 2       2       Past Medical History  Diagnosis Date  . No pertinent past medical history   . Depression   . Anxiety   . Headache     Past Surgical History  Procedure Date  . Cesarean section     Family History  Problem Relation Age of Onset  . Heart disease Mother   . Heart disease Father   . Hypertension Father   . Anesthesia problems Neg Hx   . Hypotension Neg Hx   . Malignant hyperthermia Neg Hx   . Pseudochol deficiency Neg Hx     History  Substance Use Topics  . Smoking status: Current Some Day Smoker  . Smokeless tobacco: Never Used  . Alcohol Use: No    Allergies: No Known Allergies  Prescriptions prior to admission  Medication Sig Dispense Refill  . ibuprofen (ADVIL,MOTRIN) 200 MG tablet Take 800 mg by mouth every 6 (six) hours as needed. For pain/headaches       . PARoxetine (PAXIL) 20 MG tablet Take 20 mg by mouth every morning.        . SUMAtriptan (IMITREX) 50 MG tablet Take 50 mg by mouth every 2 (two) hours as needed. Patient states that the physician informed her to stop this medication.      . topiramate (TOPAMAX) 100 MG tablet Take 100 mg by mouth daily.          Review of Systems  Gastrointestinal: Negative for nausea, vomiting and abdominal pain.  Genitourinary: Negative for dysuria.       Vaginal bleeding   Physical Exam   Blood pressure 122/66, pulse 102, temperature 98.4 F (36.9 C), temperature source Oral, resp. rate 20, height 5' 3.25" (1.607 m), weight 214 lb 2 oz (97.126 kg), last menstrual period 06/29/2011, SpO2  98.00%.  Physical Exam  Nursing note and vitals reviewed. Constitutional: She is oriented to person, place, and time. She appears well-developed and well-nourished.  HENT:  Head: Normocephalic.  Eyes: EOM are normal.  Neck: Neck supple.  GI: Soft. There is no tenderness.  Genitourinary:       Speculum exam: Vulva - no blood seen Vagina - Small amount of light bloody discharge, no odor Cervix - No contact bleeding Bimanual exam: Cervix closed Uterus exam limited by habitus Adnexa exam limited by habitus Chaperone present for exam.  Musculoskeletal: Normal range of motion.  Neurological: She is alert and oriented to person, place, and time.  Skin: Skin is warm and dry.  Psychiatric: She has a normal mood and affect.    MAU Course  Procedures  MDM Ultrasound 10w 0e with FHT 175.  No reason for bleeding identified.  Assessment and Plan  Bleeding after intercourse in pregnancy  Plan No sex while you are bleeding. Drink at least 8 8-oz glasses of water every day. Call your doctor if you have questions or concerns.  BURLESON,TERRI 09/05/2011, 2:27 AM   Nolene Bernheim,  NP 09/05/11 1610

## 2011-09-05 NOTE — Progress Notes (Signed)
Lilyan Punt NP in and [place] results discussed with d/c plan. Written and verbal d/c instructions given and understanding voiced.

## 2011-10-11 ENCOUNTER — Encounter (HOSPITAL_COMMUNITY): Payer: Self-pay | Admitting: *Deleted

## 2011-10-11 ENCOUNTER — Inpatient Hospital Stay (HOSPITAL_COMMUNITY)
Admission: AD | Admit: 2011-10-11 | Discharge: 2011-10-11 | Disposition: A | Discharge status: Home Health | Payer: BC Managed Care – PPO | Source: Ambulatory Visit | Attending: Obstetrics and Gynecology | Admitting: Obstetrics and Gynecology

## 2011-10-11 DIAGNOSIS — O99891 Other specified diseases and conditions complicating pregnancy: Secondary | ICD-10-CM | POA: Insufficient documentation

## 2011-10-11 DIAGNOSIS — Z331 Pregnant state, incidental: Secondary | ICD-10-CM

## 2011-10-11 DIAGNOSIS — G43909 Migraine, unspecified, not intractable, without status migrainosus: Secondary | ICD-10-CM | POA: Insufficient documentation

## 2011-10-11 HISTORY — DX: Gonococcal infection, unspecified: A54.9

## 2011-10-11 HISTORY — DX: Unspecified abnormal cytological findings in specimens from cervix uteri: R87.619

## 2011-10-11 HISTORY — DX: Reserved for concepts with insufficient information to code with codable children: IMO0002

## 2011-10-11 HISTORY — DX: Papillomavirus as the cause of diseases classified elsewhere: B97.7

## 2011-10-11 LAB — URINALYSIS, ROUTINE W REFLEX MICROSCOPIC
Bilirubin Urine: NEGATIVE
Glucose, UA: NEGATIVE mg/dL
Ketones, ur: NEGATIVE mg/dL
Nitrite: NEGATIVE
Specific Gravity, Urine: <1.005 — ABNORMAL LOW (ref 1.005–1.030)
pH: 6.5 (ref 5.0–8.0)

## 2011-10-11 MED ORDER — LACTATED RINGERS IV SOLN
INTRAVENOUS | Status: DC
  Administered 2011-10-11: 16:00:00 via INTRAVENOUS

## 2011-10-11 MED ORDER — BUTALBITAL-APAP-CAFFEINE 50-325-40 MG PO TABS: 1.0000 | ORAL_TABLET | Freq: Four times a day (QID) | ORAL | Status: AC | PRN

## 2011-10-11 MED ORDER — PROMETHAZINE HCL 25 MG PO TABS: ORAL_TABLET | ORAL | Status: DC

## 2011-10-11 MED ORDER — DEXAMETHASONE SODIUM PHOSPHATE 10 MG/ML IJ SOLN
10.0000 mg | Freq: Once | INTRAMUSCULAR | Status: AC
  Administered 2011-10-11: 10 mg via INTRAVENOUS
  Filled 2011-10-11: qty 1

## 2011-10-11 MED ORDER — DIPHENHYDRAMINE HCL 50 MG/ML IJ SOLN
25.0000 mg | Freq: Once | INTRAMUSCULAR | Status: AC
  Administered 2011-10-11: 16:00:00 via INTRAVENOUS
  Filled 2011-10-11: qty 1

## 2011-10-11 MED ORDER — METOCLOPRAMIDE HCL 5 MG/ML IJ SOLN
10.0000 mg | Freq: Once | INTRAMUSCULAR | Status: AC
  Administered 2011-10-11: 10 mg via INTRAVENOUS
  Filled 2011-10-11: qty 2

## 2011-10-11 NOTE — Progress Notes (Signed)
Migraine started this a.m. At 0700.  Has taken Tylenol x3 today, also a Dramamine, with no relief.  Dr. Tenny Craw has prescribed fioricet but pt has no refills.  States it works if she takes it before the headache gets bad.

## 2011-10-11 NOTE — Progress Notes (Signed)
Pt reports having a migraine headache that started around 9am. Reports n/v along with it. Pt reports taken tylenil and dramamine around 1pm but stated she vomited them tight back up.

## 2011-10-11 NOTE — ED Provider Notes (Signed)
History     CSN: [id number]  Arrival date & time 10/11/11  1331   None     No chief complaint on file.   (Consider location/radiation/quality/duration/timing/severity/associated sxs/prior treatment) HPI [name] is a 26 y.o. G3P2002 at [redacted]w[redacted]d. She woke up with her typical migraine this am. She has taken Tylenol x 2 and Dramamine x 1, has vomited it all back up. She is having visual changes, bright flashes. She was dx with migraines 2 yr ago, this is her 3rd "bad" migraine this pregnancy. She has a PNV scheduled 3/15 with Dr Fredia Sorrow.  Past Medical History  Diagnosis Date  . Depression   . Anxiety   . Headache   . Abnormal Pap smear   . Gonorrhea   . HPV (human papilloma virus) infection     Past Surgical History  Procedure Date  . Cesarean section   . Colposcopy   . Wisdom tooth extraction     Family History  Problem Relation Age of Onset  . Heart disease Mother   . Heart disease Father   . Hypertension Father   . Anesthesia problems Neg Hx   . Hypotension Neg Hx   . Malignant hyperthermia Neg Hx   . Pseudochol deficiency Neg Hx     History  Substance Use Topics  . Smoking status: Former Games developer  . Smokeless tobacco: Never Used  . Alcohol Use: No    OB History    Grav Para Term Preterm Abortions TAB SAB Ect Mult Living   3 2 2       2       Review of Systems  Constitutional: Negative for fever and chills.  Eyes: Positive for photophobia and visual disturbance.  Genitourinary: Negative for vaginal bleeding and vaginal discharge.  Neurological: Positive for headaches. Negative for dizziness, tremors, seizures, syncope, facial asymmetry, speech difficulty, weakness, light-headedness and numbness.  Psychiatric/Behavioral: Negative.     Allergies  Review of patient's allergies indicates no known allergies.  Home Medications  No current outpatient prescriptions on file.  BP 128/62  Pulse 85  Temp(Src) 98.1 F (36.7 C) (Oral)  Resp 18  Ht 5\' 3"   (1.6 m)  Wt 213 lb 3.2 oz (96.707 kg)  BMI 37.77 kg/m2  LMP 06/29/2011  Physical Exam  Constitutional: She is oriented to person, place, and time. She appears well-developed and well-nourished. No distress.  HENT:  Head: Normocephalic.  Musculoskeletal: Normal range of motion.  Neurological: She is alert and oriented to person, place, and time. Coordination normal.  Skin: Skin is warm and dry. She is not diaphoretic.  Psychiatric: She has a normal mood and affect. Her behavior is normal.    ED Course  Procedures (including critical care time)  Labs Reviewed  URINALYSIS, ROUTINE W REFLEX MICROSCOPIC - Abnormal; Notable for the following:    APPearance CLOUDY (*)    Specific Gravity, Urine <1.005 (*)    All other components within normal limits   No results found. ASSESSMENT:  Migraine at 14 + wks  No diagnosis found. PLAN:  IV hydration, Decadron 10 mg, Reglan 10 mg, Benadryl 25 mg IV Pt feels much better  Consulted with Dr Henderson Cloud Rx Fioricet, Phenergan fro home use Keep appt 3/15 with Dr Fredia Sorrow   MDM          Rodell Perna, NP 10/11/11 [phone]

## 2011-11-24 ENCOUNTER — Inpatient Hospital Stay (HOSPITAL_COMMUNITY)
Admission: AD | Admit: 2011-11-24 | Discharge: 2011-11-24 | Disposition: A | Discharge status: Home / Self Care | Payer: BC Managed Care – PPO | Source: Ambulatory Visit | Attending: Obstetrics and Gynecology | Admitting: Obstetrics and Gynecology

## 2011-11-24 DIAGNOSIS — N949 Unspecified condition associated with female genital organs and menstrual cycle: Secondary | ICD-10-CM

## 2011-11-24 DIAGNOSIS — G43909 Migraine, unspecified, not intractable, without status migrainosus: Secondary | ICD-10-CM | POA: Insufficient documentation

## 2011-11-24 DIAGNOSIS — O99891 Other specified diseases and conditions complicating pregnancy: Secondary | ICD-10-CM | POA: Insufficient documentation

## 2011-11-24 DIAGNOSIS — R109 Unspecified abdominal pain: Secondary | ICD-10-CM | POA: Insufficient documentation

## 2011-11-24 LAB — URINALYSIS, ROUTINE W REFLEX MICROSCOPIC
Bilirubin Urine: NEGATIVE
Hgb urine dipstick: NEGATIVE
Nitrite: NEGATIVE
Specific Gravity, Urine: <1.005 — ABNORMAL LOW (ref 1.005–1.030)
Urobilinogen, UA: 0.2 mg/dL (ref 0.0–1.0)
pH: 7 (ref 5.0–8.0)

## 2011-11-24 MED ORDER — METOCLOPRAMIDE HCL 5 MG/ML IJ SOLN
10.0000 mg | Freq: Once | INTRAMUSCULAR | Status: AC
  Administered 2011-11-24: 10 mg via INTRAVENOUS
  Filled 2011-11-24: qty 2

## 2011-11-24 MED ORDER — DIPHENHYDRAMINE HCL 50 MG/ML IJ SOLN
25.0000 mg | Freq: Once | INTRAMUSCULAR | Status: AC
  Administered 2011-11-24: 25 mg via INTRAVENOUS
  Filled 2011-11-24: qty 1

## 2011-11-24 MED ORDER — DEXAMETHASONE SODIUM PHOSPHATE 10 MG/ML IJ SOLN
10.0000 mg | Freq: Once | INTRAMUSCULAR | Status: AC
  Administered 2011-11-24: 10 mg via INTRAVENOUS
  Filled 2011-11-24: qty 1

## 2011-11-24 MED ORDER — LACTATED RINGERS IV BOLUS (SEPSIS)
1000.0000 mL | Freq: Once | INTRAVENOUS | Status: AC
  Administered 2011-11-24: 1000 mL via INTRAVENOUS

## 2011-11-24 NOTE — MAU Note (Signed)
Pt sttes has had a migraine x 3 days-has taken tylenol and fiorcet with no relief-also complains of lower abd pain on the left side since 1300 today-sates she was told not to get pregnant because with her last C/S she had developed an opening in the uterus

## 2011-11-24 NOTE — MAU Provider Note (Signed)
History     CSN: [id number]  Arrival date and time: 11/24/11 2108   None     Chief Complaint  Patient presents with  . Migraine   HPI 26 y.o. [id number] at [redacted]w[redacted]d. Headache x 3 days, no better with fioricet or tylenol. H/O migraines, this is the 3rd or 4th "bad headache" this pregnancy, others resolved in MAU with IV hydration, decadron, reglan and phenergan or benadryl. Left sided abd pain tonight, inguinal area, shooting, hurts with sitting up, better with walking or laying down. No bleeding or discharge.   Past Medical History  Diagnosis Date  . Depression   . Anxiety   . Headache   . Abnormal Pap smear   . Gonorrhea   . HPV (human papilloma virus) infection     Past Surgical History  Procedure Date  . Cesarean section   . Colposcopy   . Wisdom tooth extraction     Family History  Problem Relation Age of Onset  . Heart disease Mother   . Heart disease Father   . Hypertension Father   . Anesthesia problems Neg Hx   . Hypotension Neg Hx   . Malignant hyperthermia Neg Hx   . Pseudochol deficiency Neg Hx     History  Substance Use Topics  . Smoking status: Former Games developer  . Smokeless tobacco: Never Used  . Alcohol Use: No    Allergies: No Known Allergies  Prescriptions prior to admission  Medication Sig Dispense Refill  . acetaminophen (TYLENOL) 325 MG tablet Take 650 mg by mouth every 6 (six) hours as needed. For pain      . butalbital-acetaminophen-caffeine (FIORICET) 50-325-40 MG per tablet Take 1-2 tablets by mouth every 6 (six) hours as needed for headache.  20 tablet  0  . Prenatal Vit-Fe Fumarate-FA (PRENATAL MULTIVITAMIN) TABS Take 1 tablet by mouth daily.      . promethazine (PHENERGAN) 25 MG tablet Promethazine 25 mg suppositories 1/2-1 q 4-6 hrs for vomiting  10 tablet  0    Review of Systems  Constitutional: Negative.   Eyes: Positive for photophobia.  Respiratory: Negative.   Cardiovascular: Negative.   Gastrointestinal: Positive for  abdominal pain. Negative for nausea, vomiting, diarrhea and constipation.  Genitourinary: Negative for dysuria, urgency, frequency, hematuria and flank pain.       Negative for vaginal bleeding, cramping/contractions  Musculoskeletal: Negative.   Neurological: Positive for headaches.  Psychiatric/Behavioral: Negative.    Physical Exam   Blood pressure 134/67, pulse 80, temperature 98.2 F (36.8 C), temperature source Oral, resp. rate 20, height 5\' 3"  (1.6 m), weight 216 lb (97.977 kg), last menstrual period 06/29/2011, SpO2 100.00%.  Physical Exam  Nursing note and vitals reviewed. Constitutional: She is oriented to person, place, and time. She appears well-developed and well-nourished. No distress.  Cardiovascular: Normal rate.   Respiratory: Effort normal.  GI: Soft. She exhibits no distension (mild LLQ) and no mass. There is tenderness. There is no rebound and no guarding.  Musculoskeletal: Normal range of motion.  Neurological: She is alert and oriented to person, place, and time.  Skin: Skin is warm and dry.  Psychiatric: She has a normal mood and affect.    MAU Course  Procedures  Results for orders placed during the hospital encounter of 11/24/11 (from the past 24 hour(s))  URINALYSIS, ROUTINE W REFLEX MICROSCOPIC     Status: Abnormal   Collection Time   11/24/11  9:35 PM      Component Value Range   Color,  Urine YELLOW  YELLOW    APPearance CLEAR  CLEAR    Specific Gravity, Urine <1.005 (*) 1.005 - 1.030    pH 7.0  5.0 - 8.0    Glucose, UA NEGATIVE  NEGATIVE (mg/dL)   Hgb urine dipstick NEGATIVE  NEGATIVE    Bilirubin Urine NEGATIVE  NEGATIVE    Ketones, ur NEGATIVE  NEGATIVE (mg/dL)   Protein, ur NEGATIVE  NEGATIVE (mg/dL)   Urobilinogen, UA 0.2  0.0 - 1.0 (mg/dL)   Nitrite NEGATIVE  NEGATIVE    Leukocytes, UA NEGATIVE  NEGATIVE    Headache improved with IV LR, Decadron 10 mg, Benadryl 25mg  and Reglan 10 mg  TOCO quiet  Assessment and Plan  25 y.o. [id number] at  [redacted]w[redacted]d Migraine - now resolved Round ligament pain F/U as scheduled or sooner PRN  Nasha Diss 11/24/2011, 10:34 PM

## 2011-12-02 ENCOUNTER — Encounter: Payer: Self-pay | Admitting: *Deleted

## 2011-12-11 ENCOUNTER — Encounter: Payer: Self-pay | Admitting: Cardiology

## 2011-12-11 ENCOUNTER — Ambulatory Visit (INDEPENDENT_AMBULATORY_CARE_PROVIDER_SITE_OTHER): Payer: BC Managed Care – PPO | Admitting: Cardiology

## 2011-12-11 VITALS — BP 124/78 | HR 80 | Ht 63.0 in | Wt 217.0 lb

## 2011-12-11 DIAGNOSIS — R002 Palpitations: Secondary | ICD-10-CM | POA: Insufficient documentation

## 2011-12-11 LAB — BASIC METABOLIC PANEL
CO2: 24 mEq/L (ref 19–32)
Calcium: 9.7 mg/dL (ref 8.4–10.5)
Creatinine, Ser: 0.4 mg/dL (ref 0.4–1.2)
Potassium: 3.3 mEq/L — ABNORMAL LOW (ref 3.5–5.1)
Sodium: 140 mEq/L (ref 135–145)

## 2011-12-11 NOTE — Assessment & Plan Note (Signed)
These are most likely secondary to premature beats which we see on her EKG today. These are being exacerbated by her significant use of caffeine, nicotine, and perhaps even the stress of pregnancy. She also takes Fioricet which has caffeine in it.  We'll check a potassium and magnesium. Her TSH was recently normal. She does not look anemic. With a normal exam and normal EKG we'll not obtain an echocardiogram. I'll see her back when necessary.

## 2011-12-11 NOTE — Patient Instructions (Signed)
Your physician recommends that you have lab work today: bmp, mg.  We will call you with your results.  Your physician discussed the hazards of tobacco use. Tobacco use cessation is recommended and techniques and options to help you quit were discussed.  Your physician has recommended that you stop drinking coffee and caffeinated beverages

## 2011-12-11 NOTE — Progress Notes (Signed)
HPI [name] is a 26 year old married white female who comes today for evaluation of intermittent palpitations. She is referred by Dr. Tenny Craw.  She is currently [redacted] weeks pregnant with her third child. She has intermittent palpitations which usually occur at rest or lying down. The longest event lasted 3 minutes. She gets sweaty sometimes feels lightheaded but has not passed out. They do not occur with exertion.  Her thyroid was recently checked and is normal.  She also drinks 3 caffeinated beverages a day. She takes Fioricet which has caffeine in it. She's also is a smoker but says she's quitting. She denies any illicit drugs.  Past Medical History  Diagnosis Date  . Depression   . Anxiety   . Headache   . Abnormal Pap smear   . Gonorrhea   . HPV (human papilloma virus) infection   . Migraines   . Hx of scarlet fever 1996    Current Outpatient Prescriptions  Medication Sig Dispense Refill  . butalbital-acetaminophen-caffeine (FIORICET) 50-325-40 MG per tablet Take 1-2 tablets by mouth every 6 (six) hours as needed for headache.  20 tablet  0  . docusate sodium (COLACE) 100 MG capsule Take 100 mg by mouth daily.      . ferrous fumarate (HEMOCYTE - 106 MG FE) 325 (106 FE) MG TABS Take 1 tablet by mouth daily.      . Prenatal Vit-Fe Fumarate-FA (PRENATAL MULTIVITAMIN) TABS Take 1 tablet by mouth daily.      . diphenhydrAMINE (BENADRYL) 25 MG tablet Take 25 mg by mouth as needed. Used for allergies      . DISCONTD: SUMAtriptan (IMITREX) 50 MG tablet Take 50 mg by mouth every 2 (two) hours as needed. Patient states that the physician informed her to stop this medication.        No Known Allergies  Family History  Problem Relation Age of Onset  . Heart disease Mother   . Heart disease Father   . Hypertension Father   . Anesthesia problems Neg Hx   . Hypotension Neg Hx   . Malignant hyperthermia Neg Hx   . Pseudochol deficiency Neg Hx   . Heart murmur Mother   . Heart attack  Father     twice  . Liver cancer Father   . Lung cancer Father     stage 4    History   Social History  . Marital Status: Married    Spouse Name: N/A    Number of Children: 2  . 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 Smoking status: Current Everyday Smoker  . Smokeless tobacco: Never Used   Comment: 4 cigarettes daily  . Alcohol Use: No  . Drug Use: No  . Sexually Active: Yes   Other Topics Concern  . Not on file   Social History Narrative  . No narrative on file    ROS ALL NEGATIVE EXCEPT THOSE NOTED IN HPI  PE  General Appearance: well developed, well nourished in no acute distress, gravida, obese HEENT: symmetrical face, PERRLA, good dentition  Neck: no JVD, thyromegaly, or adenopathy, trachea midline Chest: symmetric without deformity Cardiac: PMI DIFFICULT TO LOCATE, RRR, normal S1, S2, no gallop or murmur Lung: clear to ausculation and percussion Vascular: all pulses full without bruits  Abdominal: Distended, nontender, good bowel sounds,  Extremities: no cyanosis, clubbing or edema, no sign of DVT, no varicosities  Skin: normal color,  no rashes Neuro: alert and oriented x 3, non-focal Pysch: normal affect  EKG Normal sinus rhythm, PACs and unifocal PVCs BMET    Component Value Date/Time   NA 138 02/23/2009 0139   K 3.9 02/23/2009 0139   CL 108 02/23/2009 0139   CO2 22 02/23/2009 0139   GLUCOSE 86 02/23/2009 0139   BUN 8 02/23/2009 0139   CREATININE 0.62 02/23/2009 0139   CALCIUM 9.1 02/23/2009 0139   GFRNONAA >60 02/23/2009 0139   GFRAA  Value: >60        The eGFR has been calculated using the MDRD equation. This calculation has not been validated in all clinical situations. eGFR's persistently <60 mL/min signify possible Chronic Kidney Disease. 02/23/2009 0139    Lipid Panel  No results found for this basename: chol, trig, hdl, cholhdl, vldl, ldlcalc    CBC    Component Value  Date/Time   WBC 8.1 08/05/2011 1800   RBC 4.01 08/05/2011 1800   HGB 12.3 08/05/2011 1800   HCT 36.2 08/05/2011 1800   PLT 271 08/05/2011 1800   MCV 90.3 08/05/2011 1800   MCH 30.7 08/05/2011 1800   MCHC 34.0 08/05/2011 1800   RDW 13.2 08/05/2011 1800   LYMPHSABS 2.6 02/23/2009 0139   MONOABS 0.6 02/23/2009 0139   EOSABS 0.2 02/23/2009 0139   BASOSABS 0.0 02/23/2009 0139

## 2012-01-11 ENCOUNTER — Inpatient Hospital Stay (HOSPITAL_COMMUNITY)
Admission: AD | Admit: 2012-01-11 | Discharge: 2012-01-11 | Disposition: A | Discharge status: Home / Self Care | Payer: BC Managed Care – PPO | Source: Ambulatory Visit | Attending: Obstetrics and Gynecology | Admitting: Obstetrics and Gynecology

## 2012-01-11 ENCOUNTER — Encounter (HOSPITAL_COMMUNITY): Payer: Self-pay | Admitting: *Deleted

## 2012-01-11 DIAGNOSIS — M549 Dorsalgia, unspecified: Secondary | ICD-10-CM | POA: Insufficient documentation

## 2012-01-11 DIAGNOSIS — R109 Unspecified abdominal pain: Secondary | ICD-10-CM | POA: Insufficient documentation

## 2012-01-11 DIAGNOSIS — N949 Unspecified condition associated with female genital organs and menstrual cycle: Secondary | ICD-10-CM

## 2012-01-11 DIAGNOSIS — O99891 Other specified diseases and conditions complicating pregnancy: Secondary | ICD-10-CM

## 2012-01-11 DIAGNOSIS — O4700 False labor before 37 completed weeks of gestation, unspecified trimester: Secondary | ICD-10-CM

## 2012-01-11 HISTORY — DX: Ventricular premature depolarization: I49.3

## 2012-01-11 LAB — URINALYSIS, ROUTINE W REFLEX MICROSCOPIC
Bilirubin Urine: NEGATIVE
Glucose, UA: NEGATIVE mg/dL
Hgb urine dipstick: NEGATIVE
Ketones, ur: NEGATIVE mg/dL
Leukocytes, UA: NEGATIVE
Protein, ur: NEGATIVE mg/dL

## 2012-01-11 LAB — WET PREP, GENITAL

## 2012-01-11 NOTE — MAU Note (Signed)
Stared about 1200, pains in the bottom of stomach- every 15- .  Shaky and dizzy with it.  Called dr- drank some water and laid down as instructed, pains have continued. (in low back too)

## 2012-01-11 NOTE — Discharge Instructions (Signed)
Back Pain in Pregnancy  Back pain during pregnancy is common. It happens in about half of all pregnancies. It is important for you and your baby that you remain active during your pregnancy.If you feel that back pain is not allowing you to remain active or sleep well, it is time to see your caregiver. Back pain may be caused by several factors related to changes during your pregnancy.Fortunately, unless you had trouble with your back before your pregnancy, the pain is likely to get better after you deliver.  Low back pain usually occurs between the fifth and seventh months of pregnancy. It can, however, happen in the first couple months. Factors that increase the risk of back problems include:    Previous back problems.   Injury to your back.   Having twins or multiple births.   A chronic cough.   Stress.   Job-related repetitive motions.   Muscle or spinal disease in the back.   Family history of back problems, ruptured (herniated) discs, or osteoporosis.   Depression, anxiety, and panic attacks.  CAUSES    When you are pregnant, your body produces a hormone called relaxin. This hormonemakes the ligaments connecting the low back and pubic bones more flexible. This flexibility allows the baby to be delivered more easily. When your ligaments are loose, your muscles need to work harder to support your back. Soreness in your back can come from tired muscles. Soreness can also come from back tissues that are irritated since they are receiving less support.   As the baby grows, it puts pressure on the nerves and blood vessels in your pelvis. This can cause back pain.   As the baby grows and gets heavier during pregnancy, the uterus pushes the stomach muscles forward and changes your center of gravity. This makes your back muscles work harder to maintain good posture.  SYMPTOMS   Lumbar pain during pregnancy  Lumbar pain during pregnancy usually occurs at or above the waist in the center of the back. There  may be pain and numbness that radiates into your leg or foot. This is similar to low back pain experienced by non-pregnant women. It usually increases with sitting for long periods of time, standing, or repetitive lifting. Tenderness may also be present in the muscles along your upper back.  Posterior pelvic pain during pregnancy  Pain in the back of the pelvis is more common than lumbar pain in pregnancy. It is a deep pain felt in your side at the waistline, or across the tailbone (sacrum), or in both places. You may have pain on one or both sides. This pain can also go into the buttocks and backs of the upper thighs. Pubic and groin pain may also be present. The pain does not quickly resolve with rest, and morning stiffness may also be present.  Pelvic pain during pregnancy can be brought on by most activities. A high level of fitness before and during pregnancy may or may not prevent this problem. Labor pain is usually 1 to 2 minutes apart, lasts for about 1 minute, and involves a bearing down feeling or pressure in your pelvis. However, if you are at term with the pregnancy, constant low back pain can be the beginning of early labor, and you should be aware of this.  DIAGNOSIS   X-rays of the back should not be done during the first 12 to 14 weeks of the pregnancy and only when absolutely necessary during the rest of the pregnancy. MRIs do   not give off radiation and are safe during pregnancy. MRIs also should only be done when absolutely necessary.  HOME CARE INSTRUCTIONS   Exercise as directed by your caregiver. Exercise is the most effective way to prevent or manage back pain. If you have a back problem, it is especially important to avoid sports that require sudden body movements. Swimming and walking are great activities.   Do not stand in one place for long periods of time.   Do not wear high heels.   Sit in chairs with good posture. Use a pillow on your lower back if necessary. Make sure your head  rests over your shoulders and is not hanging forward.   Try sleeping on your side, preferably the left side, with a pillow or two between your legs. If you are sore after a night's rest, your bedmay betoo soft.Try placing a board between your mattress and box spring.   Listen to your body when lifting.If you are experiencing pain, ask for help or try bending yourknees more so you can use your leg muscles rather than your back muscles. Squat down when picking up something from the floor. Do not bend over.   Eat a healthy diet. Try to gain weight within your caregiver's recommendations.   Use heat or cold packs 3 to 4 times a day for 15 minutes to help with the pain.   Only take over-the-counter or prescription medicines for pain, discomfort, or fever as directed by your caregiver.  Sudden (acute) back pain   Use bed rest for only the most extreme, acute episodes of back pain. Prolonged bed rest over 48 hours will aggravate your condition.   Ice is very effective for acute conditions.   Put ice in a plastic bag.   Place a towel between your skin and the bag.   Leave the ice on for 10 to 20 minutes every 2 hours, or as needed.   Using heat packs for 30 minutes prior to activities is also helpful.  Continued back pain  See your caregiver if you have continued problems. Your caregiver can help or refer you for appropriate physical therapy. With conditioning, most back problems can be avoided. Sometimes, a more serious issue may be the cause of back pain. You should be seen right away if new problems seem to be developing. Your caregiver may recommend:   A maternity girdle.   An elastic sling.   A back brace.   A massage therapist or acupuncture.  SEEK MEDICAL CARE IF:    You are not able to do most of your daily activities, even when taking the pain medicine you were given.   You need a referral to a physical therapist or chiropractor.   You want to try acupuncture.  SEEK IMMEDIATE MEDICAL CARE  IF:   You develop numbness, tingling, weakness, or problems with the use of your arms or legs.   You develop severe back pain that is no longer relieved with medicines.   You have a sudden change in bowel or bladder control.   You have increasing pain in other areas of the body.   You develop shortness of breath, dizziness, or fainting.   You develop nausea, vomiting, or sweating.   You have back pain which is similar to labor pains.   You have back pain along with your water breaking or vaginal bleeding.   You have back pain or numbness that travels down your leg.   Your back pain developed after   kidney stone.   You see blood in your urine. You may have a bladder infection or kidney stone.   You have back pain with blisters. You may have shingles.  Back pain is fairly common during pregnancy but should not be accepted as just part of the process. Back pain should always be treated as soon as possible. This will make your pregnancy as pleasant as possible. Document Released: 10/28/2005 Document Revised: 07/09/2011 Document Reviewed: 12/09/2010 [facility] Patient Information 2012 [place], [place]. Round Ligament Pain The round ligament is made up of muscle and fibrous tissue. It is attached to the uterus near the fallopian tube. The round ligament is located on both sides of the uterus and helps support the position of the uterus. It usually begins in the second trimester of pregnancy when the uterus comes out of the pelvis. The pain can come and go until the baby is delivered. Round ligament pain is not a serious problem and does not cause harm to the baby. CAUSE During pregnancy the uterus grows the most from the second trimester to delivery. As it grows, it stretches and slightly twists the round ligaments. When the uterus leans from one side to the other, the round ligament on the  opposite side pulls and stretches. This can cause pain. SYMPTOMS  Pain can occur on one side or both sides. The pain is usually a short, sharp, and pinching-like. Sometimes it can be a dull, lingering and aching pain. The pain is located in the lower side of the abdomen or in the groin. The pain is internal and usually starts deep in the groin and moves up to the outside of the hip area. Pain can occur with:  Sudden change in position like getting out of bed or a chair.   Rolling over in bed.   Coughing or sneezing.   Walking too much.   Any type of physical activity.  DIAGNOSIS  Your caregiver will make sure there are no serious problems causing the pain. When nothing serious is found, the symptoms usually indicate that the pain is from the round ligament. TREATMENT   Sit down and relax when the pain starts.   Flex your knees up to your belly.   Lay on your side with a pillow under your belly (abdomen) and another one between your legs.   Sit in a hot bath for 15 to 20 minutes or until the pain goes away.  HOME CARE INSTRUCTIONS   Only take over-the-counter or prescriptions medicines for pain, discomfort or fever as directed by your caregiver.   Sit and stand slowly.   Avoid long walks if it causes pain.   Stop or lessen your physical activities if it causes pain.  SEEK MEDICAL CARE IF:   The pain does not go away with any of your treatment.   You need stronger medication for the pain.   You develop back pain that you did not have before with the side pain.  SEEK IMMEDIATE MEDICAL CARE IF:   You develop a temperature of 102 F (38.9 C) or higher.   You develop uterine contractions.   You develop vaginal bleeding.   You develop nausea, vomiting or diarrhea.   You develop chills.   You have pain when you urinate.  Document Released: 04/28/2008 Document Revised: 07/09/2011 Document Reviewed: 04/28/2008 [facility] Patient Information 2012 [place], [place].

## 2012-01-11 NOTE — MAU Note (Signed)
Pt states she has been hurting in her lower back since noon today. Pt denies any bleeding or leakage of fluid. States some discharge.

## 2012-01-11 NOTE — MAU Provider Note (Signed)
Chief Complaint:  Abdominal Pain and Back Pain   First Provider Initiated Contact with Patient 01/11/12 1746      HPI  [name] is a 26 y.o. G3P2002 at [redacted]w[redacted]d presenting with back and abdominal pain described as intermittent, every 15 minutes, that started when she was walking at the grocery store. She reports good fetal movement, denies LOF, vaginal bleeding, vaginal itching/burning, urinary symptoms, h/a, dizziness, n/v, or fever/chills.     Pregnancy Course: uncomplicated  Past Medical History: Past Medical History  Diagnosis Date  . Depression   . Anxiety   . Headache   . Abnormal Pap smear   . Gonorrhea   . HPV (human papilloma virus) infection     age 89  . Migraines   . Hx of scarlet fever 1996  . PVC (premature ventricular contraction)   . Urinary tract infection     Past Surgical History: Past Surgical History  Procedure Date  . Cesarean section 2008, 2009  . 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 Wisdom tooth extraction     Family History: Family History  Problem Relation Age of Onset  . Heart disease Mother   . Heart murmur Mother   . Heart disease Father   . Hypertension Father   . Heart attack Father     twice  . Liver cancer Father   . Lung cancer Father     stage 4  . Cancer Father     lung and liver, dx 12/2011  . Anesthesia problems Neg Hx   . Hypotension Neg Hx   . Malignant hyperthermia Neg Hx   . Pseudochol deficiency Neg Hx   . Hearing loss Neg Hx     Social History: History  Substance Use Topics  . Smoking status: Current Everyday Smoker -- 0.2 packs/day for 8 years    Types: Cigarettes  . Smokeless tobacco: Never Used  . Alcohol Use: No    Allergies: No Known Allergies  Meds:  Prescriptions prior to admission  Medication Sig Dispense Refill  . butalbital-acetaminophen-caffeine (FIORICET) 50-325-40 MG per tablet Take 1-2 tablets by mouth every 6 (six) hours as needed for headache.  20 tablet  0  . calcium  carbonate (TUMS - DOSED IN MG ELEMENTAL CALCIUM) 500 MG chewable tablet Chew 1 tablet by mouth daily. For heartburn.      . diphenhydrAMINE (BENADRYL) 25 MG tablet Take 25 mg by mouth as needed. Used for allergies      . docusate sodium (COLACE) 100 MG capsule Take 100 mg by mouth daily.      . ferrous fumarate (HEMOCYTE - 106 MG FE) 325 (106 FE) MG TABS Take 1 tablet by mouth daily.      . Prenatal Vit-Fe Fumarate-FA (PRENATAL MULTIVITAMIN) TABS Take 1 tablet by mouth daily.          Physical Exam  Blood pressure 124/75, pulse 98, temperature 98.4 F (36.9 C), temperature source Oral, resp. rate 20, height 5\' 3"  (1.6 m), weight 100.699 kg (222 lb), last menstrual period 06/29/2011. GENERAL: Well-developed, well-nourished female in no acute distress.  HEENT: normocephalic, good dentition HEART: normal rate RESP: normal effort ABDOMEN: Soft, nontender, gravid appropriate for gestational age EXTREMITIES: Nontender, no edema NEURO: alert and oriented Pelvic exam: Cervix pink, visually closed, without lesion, scant white creamy discharge, vaginal walls and external genitalia normal, FFN collected Cervix 0/long/high, firm, posterior  Cervix rechecked in 2 hours and 0/long/high, firm, posterior  FHT:  Baseline 140 , moderate variability, accelerations present, no decelerations Contractions: None   Labs: Results for orders placed during the hospital encounter of 01/11/12 (from the past 24 hour(s))  URINALYSIS, ROUTINE W REFLEX MICROSCOPIC     Status: Abnormal   Collection Time   01/11/12  4:45 PM      Component Value Range   Color, Urine YELLOW  YELLOW    APPearance CLEAR  CLEAR    Specific Gravity, Urine <1.005 (*) 1.005 - 1.030    pH 7.0  5.0 - 8.0    Glucose, UA NEGATIVE  NEGATIVE (mg/dL)   Hgb urine dipstick NEGATIVE  NEGATIVE    Bilirubin Urine NEGATIVE  NEGATIVE    Ketones, ur NEGATIVE  NEGATIVE (mg/dL)   Protein, ur NEGATIVE  NEGATIVE (mg/dL)   Urobilinogen, UA 0.2  0.0 -  1.0 (mg/dL)   Nitrite NEGATIVE  NEGATIVE    Leukocytes, UA NEGATIVE  NEGATIVE   WET PREP, GENITAL     Status: Abnormal   Collection Time   01/11/12  6:50 PM      Component Value Range   Yeast Wet Prep HPF POC NONE SEEN  NONE SEEN    Trich, Wet Prep NONE SEEN  NONE SEEN    Clue Cells Wet Prep HPF POC NONE SEEN  NONE SEEN    WBC, Wet Prep HPF POC FEW (*) NONE SEEN     Assessment: Threatened preterm labor Back pain in pregnancy Round ligament pain  Plan: Called Dr Claiborne Billings to discuss assessment and findings  Send FFN and recheck cervix in  2 hours  Unable to send FFN because >30 minutes since collection  Cervix unchanged D/C home with PTL precautions Discussed pregnancy support belt, warm baths, rest F/U with Dr Tenny Craw tomorrow Return to MAU as needed  LEFTWICH-KIRBY, Marcelles Clinard 6/10/20135:55 PM

## 2012-02-17 ENCOUNTER — Other Ambulatory Visit: Payer: Self-pay | Admitting: Obstetrics and Gynecology

## 2012-03-02 ENCOUNTER — Encounter (HOSPITAL_COMMUNITY): Payer: Self-pay | Admitting: *Deleted

## 2012-03-02 ENCOUNTER — Inpatient Hospital Stay (HOSPITAL_COMMUNITY)
Admission: AD | Admit: 2012-03-02 | Discharge: 2012-03-02 | Disposition: A | Discharge status: Home / Self Care | Payer: BC Managed Care – PPO | Source: Ambulatory Visit | Attending: Obstetrics and Gynecology | Admitting: Obstetrics and Gynecology

## 2012-03-02 DIAGNOSIS — O479 False labor, unspecified: Secondary | ICD-10-CM

## 2012-03-02 DIAGNOSIS — R109 Unspecified abdominal pain: Secondary | ICD-10-CM | POA: Insufficient documentation

## 2012-03-02 DIAGNOSIS — O4700 False labor before 37 completed weeks of gestation, unspecified trimester: Secondary | ICD-10-CM

## 2012-03-02 LAB — URINALYSIS, ROUTINE W REFLEX MICROSCOPIC
Leukocytes, UA: NEGATIVE
Nitrite: NEGATIVE
Specific Gravity, Urine: 1.025 (ref 1.005–1.030)
pH: 7 (ref 5.0–8.0)

## 2012-03-02 MED ORDER — NIFEDIPINE 10 MG PO CAPS
20.0000 mg | ORAL_CAPSULE | Freq: Once | ORAL | Status: AC
  Administered 2012-03-02: 20 mg via ORAL
  Filled 2012-03-02: qty 2

## 2012-03-02 MED ORDER — NIFEDIPINE ER OSMOTIC RELEASE 30 MG PO TB24: 30.0000 mg | ORAL_TABLET | Freq: Every day | ORAL | Status: DC

## 2012-03-02 NOTE — MAU Note (Signed)
Patient states she started having abdominal pain this morning, comes and goes. Denies any bleeding or leaking but does have a discharge(is currently taking Flagyl for BAV). Reports good fetal movement.

## 2012-03-02 NOTE — MAU Note (Signed)
Pt states she has been having pain in the lower part of stomach since 9:00AM

## 2012-03-02 NOTE — MAU Provider Note (Addendum)
History     CSN: [id number]  Arrival date and time: 03/02/12 1525   None     Chief Complaint  Patient presents with  . Abdominal Pain   HPI This is a 26 year old G3 P2 002 at 35 weeks and 0 days who was sent to the MAU by her provider due to sharp pains in her abdomen that started this morning at 8:00. She felt the pain is about every 20 minutes. The patient's last anywhere between 15 and 30 seconds. She rates the pain 8/10. No palliating factors. Pains are worse when she stands up. She denies bleeding, vaginal discharge, dysuria, fevers, chills. No increased in activity over the past 2 days. She denies sexual intercourse or past 3 days  OB History    Grav Para Term Preterm Abortions TAB SAB Ect Mult Living   3 2 2       2       Past Medical History  Diagnosis Date  . Depression   . Anxiety   . Headache   . Abnormal Pap smear   . Gonorrhea   . 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 Migraines   . Hx of scarlet fever 1996  . PVC (premature ventricular contraction)   . Urinary tract infection     Past Surgical History  Procedure Date  . Cesarean section 2008, 2009  . 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 Wisdom tooth extraction     Family History  Problem Relation Age of Onset  . Heart disease Mother   . Heart murmur Mother   . Heart disease Father   . Hypertension Father   . Heart attack Father     twice  . Liver cancer Father   . Lung cancer Father     stage 4  . Cancer Father     lung and liver, dx 12/2011  . Anesthesia problems Neg Hx   . Hypotension Neg Hx   . Malignant hyperthermia Neg Hx   . Pseudochol deficiency Neg Hx   . Hearing loss Neg Hx     History  Substance Use Topics  . Smoking status: Current Everyday Smoker -- 0.2 packs/day for 8 years    Types: Cigarettes  . Smokeless tobacco: Never Used  . Alcohol Use: No    Allergies: No Known Allergies  Prescriptions prior to admission  Medication Sig Dispense  Refill  . buPROPion (WELLBUTRIN) 75 MG tablet Take 75 mg by mouth 2 (two) times daily. depression      . butalbital-acetaminophen-caffeine (FIORICET) 50-325-40 MG per tablet Take 1-2 tablets by mouth every 6 (six) hours as needed for headache.  20 tablet  0  . calcium carbonate (TUMS - DOSED IN MG ELEMENTAL CALCIUM) 500 MG chewable tablet Chew 1 tablet by mouth daily. For heartburn.      . diphenhydrAMINE (BENADRYL) 25 MG tablet Take 25 mg by mouth as needed. Used for allergies      . docusate sodium (COLACE) 100 MG capsule Take 100 mg by mouth daily.      Marland Kitchen esomeprazole (NEXIUM) 20 MG capsule Take 20 mg by mouth daily before breakfast. Acid reflux      . ferrous fumarate (HEMOCYTE - 106 MG FE) 325 (106 FE) MG TABS Take 1 tablet by mouth daily.      . Prenatal Vit-Fe Fumarate-FA (PRENATAL MULTIVITAMIN) TABS Take 1 tablet by mouth daily.  ROS Physical Exam   Blood pressure 142/73, temperature 98.4 F (36.9 C), temperature source Oral, resp. rate 20, height 5\' 4"  (1.626 m), weight 100.971 kg (222 lb 9.6 oz), last menstrual period 06/29/2011, SpO2 100.00%.  Physical Exam  Constitutional: She is oriented to person, place, and time. She appears well-developed and well-nourished.  GI: Soft. She exhibits no distension and no mass. There is no tenderness. There is no rebound and no guarding.       Term fundal height  Musculoskeletal: Normal range of motion. She exhibits no edema.  Neurological: She is alert and oriented to person, place, and time.  Skin: Skin is warm and dry.  Psychiatric: She has a normal mood and affect. Her behavior is normal. Judgment and thought content normal.   Dilation: Closed Effacement (%): Thick Cervical Position: Posterior Exam by:: B.Bethea RN   Results for orders placed during the hospital encounter of 03/02/12 (from the past 24 hour(s))  URINALYSIS, ROUTINE W REFLEX MICROSCOPIC     Status: Abnormal   Collection Time   03/02/12  3:40 PM      Component  Value Range   Color, Urine YELLOW  YELLOW   APPearance HAZY (*) CLEAR   Specific Gravity, Urine 1.025  1.005 - 1.030   pH 7.0  5.0 - 8.0   Glucose, UA NEGATIVE  NEGATIVE mg/dL   Hgb urine dipstick NEGATIVE  NEGATIVE   Bilirubin Urine NEGATIVE  NEGATIVE   Ketones, ur NEGATIVE  NEGATIVE mg/dL   Protein, ur NEGATIVE  NEGATIVE mg/dL   Urobilinogen, UA 0.2  0.0 - 1.0 mg/dL   Nitrite NEGATIVE  NEGATIVE   Leukocytes, UA NEGATIVE  NEGATIVE    MAU Course  Procedures NST: category 1 tracing.  Multiple accelerations seen.  Contractions initially every 5-10 minutes.  After procardia, patient monitored for 1 hr without contractions.  Assessment and Plan  #1  Preterm contractions.  Discussed with Dr Henderson Cloud.  Will send patient home with procardia 30mg  xr.  Patient to call OB office tomorrow.  Shreyan Hinz JEHIEL 03/02/2012, 4:29 PM

## 2012-03-02 NOTE — MAU Note (Signed)
Patient is scheduled for a repeat cesarean section on 8-28. MD told her the scar is very thin.

## 2012-03-04 ENCOUNTER — Inpatient Hospital Stay (HOSPITAL_COMMUNITY)
Admission: AD | Admit: 2012-03-04 | Discharge: 2012-03-04 | Disposition: A | Discharge status: Home / Self Care | Payer: BC Managed Care – PPO | Source: Ambulatory Visit | Attending: Obstetrics & Gynecology | Admitting: Obstetrics & Gynecology

## 2012-03-04 ENCOUNTER — Encounter (HOSPITAL_COMMUNITY): Payer: Self-pay | Admitting: *Deleted

## 2012-03-04 DIAGNOSIS — O4700 False labor before 37 completed weeks of gestation, unspecified trimester: Secondary | ICD-10-CM | POA: Insufficient documentation

## 2012-03-04 LAB — URINALYSIS, ROUTINE W REFLEX MICROSCOPIC
Bilirubin Urine: NEGATIVE
Hgb urine dipstick: NEGATIVE
Protein, ur: NEGATIVE mg/dL
Urobilinogen, UA: 0.2 mg/dL (ref 0.0–1.0)

## 2012-03-04 NOTE — MAU Note (Signed)
G3P2 presents with irregular contractions at 35 weeks.  Dr. Edward Jolly noted a "window" in the lower segment when she performed the patient's last repeat cesarean.  Pt is concerned about uterine rupture.  Irregular contractions noted on fetal monitor.  FHT's are reactive.  Cervix is long and closed.  I told her I did not feel delivery was indicated at this time.  She will follow up in the office.  She will return to MAU if contractions become regular and painful.  I suggested that an ultrasound of the lower segment in the office may reassure her if it appears to be normal thickness.

## 2012-03-04 NOTE — Progress Notes (Signed)
Spoke with Dr Arlyce Dice about pt's VE, contraction pattern, FHR pattern. States that he will come and evaluate pt.

## 2012-03-04 NOTE — MAU Note (Signed)
Pt states, " I was here Wed and the doctor put me on Procardia 30 mg. It helped a little but the contractions never went away, but today contractions have been 10 min apart all day."

## 2012-03-23 ENCOUNTER — Encounter (HOSPITAL_COMMUNITY)
Admission: RE | Admit: 2012-03-23 | Discharge: 2012-03-23 | Disposition: A | Discharge status: Home / Self Care | Payer: BC Managed Care – PPO | Source: Ambulatory Visit | Attending: Obstetrics and Gynecology | Admitting: Obstetrics and Gynecology

## 2012-03-23 ENCOUNTER — Encounter (HOSPITAL_COMMUNITY): Payer: Self-pay

## 2012-03-23 HISTORY — DX: Gastro-esophageal reflux disease without esophagitis: K21.9

## 2012-03-23 LAB — TYPE AND SCREEN
ABO/RH(D): O POS
Antibody Screen: NEGATIVE

## 2012-03-23 LAB — SURGICAL PCR SCREEN: MRSA, PCR: NEGATIVE

## 2012-03-23 LAB — CBC
Hemoglobin: 12.4 g/dL (ref 12.0–15.0)
RBC: 3.97 MIL/uL (ref 3.87–5.11)

## 2012-03-23 NOTE — Patient Instructions (Addendum)
   Your procedure is scheduled on: Wednesday August 28th  Enter through the [employer] [facility] at: 9:30am Pick up the phone at the desk and dial [phone] and inform us of your arrival.  Please call this number if you have any problems the morning of surgery: [phone]  Remember: Do not eat or drink anything after midnight: Tuesday  Take these medicines the morning of surgery with a SIP OF WATER: wellbutrin  Do not wear jewelry, make-up, or FINGER nail polish No metal in your hair or on your body. Do not wear lotions, powders, perfumes or deodorant. Do not shave 48 hours prior to surgery. Do not bring valuables to the hospital. Contacts, dentures or bridgework may not be worn into surgery.  Leave suitcase in the car. After Surgery it may be brought to your room. For patients being admitted to the hospital, checkout time is 11:00am the day of discharge.     Remember to use your hibiclens as instructed.Please shower with 1/2 bottle the evening before your surgery and the other 1/2 bottle the morning of surgery. Neck down avoiding private area.

## 2012-03-24 LAB — RPR: RPR Ser Ql: NONREACTIVE

## 2012-03-29 MED ORDER — DEXTROSE 5 % IV SOLN
3.0000 g | INTRAVENOUS | Status: AC
  Administered 2012-03-30: 3 g via INTRAVENOUS
  Filled 2012-03-29: qty 3000

## 2012-03-30 ENCOUNTER — Encounter (HOSPITAL_COMMUNITY): Payer: Self-pay

## 2012-03-30 ENCOUNTER — Encounter (HOSPITAL_COMMUNITY): Payer: Self-pay | Admitting: *Deleted

## 2012-03-30 ENCOUNTER — Inpatient Hospital Stay (HOSPITAL_COMMUNITY)
Admission: RE | Admit: 2012-03-30 | Discharge: 2012-04-01 | DRG: 371 | Disposition: A | Discharge status: Home / Self Care | Payer: BC Managed Care – PPO | Source: Ambulatory Visit | Attending: Obstetrics and Gynecology | Admitting: Obstetrics and Gynecology

## 2012-03-30 ENCOUNTER — Encounter (HOSPITAL_COMMUNITY)
Admission: RE | Disposition: A | Discharge status: Home / Self Care | Payer: Self-pay | Source: Ambulatory Visit | Attending: Obstetrics and Gynecology

## 2012-03-30 ENCOUNTER — Encounter (HOSPITAL_COMMUNITY): Payer: Self-pay | Admitting: Anesthesiology

## 2012-03-30 ENCOUNTER — Inpatient Hospital Stay (HOSPITAL_COMMUNITY): Payer: BC Managed Care – PPO

## 2012-03-30 DIAGNOSIS — O34219 Maternal care for unspecified type scar from previous cesarean delivery: Principal | ICD-10-CM | POA: Diagnosis present

## 2012-03-30 DIAGNOSIS — Z302 Encounter for sterilization: Secondary | ICD-10-CM

## 2012-03-30 SURGERY — Surgical Case
Anesthesia: Spinal | Site: Abdomen | Laterality: Bilateral | Wound class: Clean Contaminated

## 2012-03-30 MED ORDER — LACTATED RINGERS IV SOLN
INTRAVENOUS | Status: DC
  Administered 2012-03-30: 19:00:00 via INTRAVENOUS

## 2012-03-30 MED ORDER — ONDANSETRON HCL 4 MG/2ML IJ SOLN: 4.0000 mg | Freq: Three times a day (TID) | INTRAMUSCULAR | Status: DC | PRN

## 2012-03-30 MED ORDER — SCOPOLAMINE 1 MG/3DAYS TD PT72
1.0000 | MEDICATED_PATCH | Freq: Once | TRANSDERMAL | Status: DC
  Administered 2012-03-30: 1.5 mg via TRANSDERMAL

## 2012-03-30 MED ORDER — DIPHENHYDRAMINE HCL 25 MG PO CAPS: 25.0000 mg | ORAL_CAPSULE | Freq: Four times a day (QID) | ORAL | Status: DC | PRN

## 2012-03-30 MED ORDER — SENNOSIDES-DOCUSATE SODIUM 8.6-50 MG PO TABS
2.0000 | ORAL_TABLET | Freq: Every day | ORAL | Status: DC
  Administered 2012-03-30 – 2012-03-31 (×2): 2 via ORAL

## 2012-03-30 MED ORDER — PROMETHAZINE HCL 25 MG/ML IJ SOLN: 6.2500 mg | INTRAMUSCULAR | Status: DC | PRN

## 2012-03-30 MED ORDER — SCOPOLAMINE 1 MG/3DAYS TD PT72
MEDICATED_PATCH | TRANSDERMAL | Status: AC
  Administered 2012-03-30: 1.5 mg via TRANSDERMAL
  Filled 2012-03-30: qty 1

## 2012-03-30 MED ORDER — SODIUM CHLORIDE 0.9 % IJ SOLN: 3.0000 mL | INTRAMUSCULAR | Status: DC | PRN

## 2012-03-30 MED ORDER — PRENATAL MULTIVITAMIN CH
1.0000 | ORAL_TABLET | Freq: Every day | ORAL | Status: DC
  Administered 2012-03-31 – 2012-04-01 (×2): 1 via ORAL
  Filled 2012-03-30 (×2): qty 1

## 2012-03-30 MED ORDER — NALBUPHINE HCL 10 MG/ML IJ SOLN
5.0000 mg | INTRAMUSCULAR | Status: DC | PRN
  Administered 2012-03-30: 5 mg via INTRAVENOUS
  Filled 2012-03-30 (×2): qty 1

## 2012-03-30 MED ORDER — FENTANYL CITRATE 0.05 MG/ML IJ SOLN
INTRAMUSCULAR | Status: DC | PRN
  Administered 2012-03-30: 25 ug via INTRATHECAL

## 2012-03-30 MED ORDER — OXYTOCIN 40 UNITS IN LACTATED RINGERS INFUSION - SIMPLE MED: 62.5000 mL/h | INTRAVENOUS | Status: AC

## 2012-03-30 MED ORDER — MEPERIDINE HCL 25 MG/ML IJ SOLN: 6.2500 mg | INTRAMUSCULAR | Status: DC | PRN

## 2012-03-30 MED ORDER — ACETAMINOPHEN 10 MG/ML IV SOLN
1000.0000 mg | Freq: Four times a day (QID) | INTRAVENOUS | Status: DC | PRN
  Filled 2012-03-30: qty 100

## 2012-03-30 MED ORDER — IBUPROFEN 600 MG PO TABS
600.0000 mg | ORAL_TABLET | Freq: Four times a day (QID) | ORAL | Status: DC
  Administered 2012-03-31 – 2012-04-01 (×6): 600 mg via ORAL
  Filled 2012-03-30 (×6): qty 1

## 2012-03-30 MED ORDER — PHENYLEPHRINE HCL 10 MG/ML IJ SOLN
INTRAMUSCULAR | Status: DC | PRN
  Administered 2012-03-30: 40 ug via INTRAVENOUS
  Administered 2012-03-30 (×3): 20 ug via INTRAVENOUS
  Administered 2012-03-30: 40 ug via INTRAVENOUS
  Administered 2012-03-30: 20 ug via INTRAVENOUS
  Administered 2012-03-30: 40 ug via INTRAVENOUS

## 2012-03-30 MED ORDER — KETOROLAC TROMETHAMINE 30 MG/ML IJ SOLN
30.0000 mg | Freq: Four times a day (QID) | INTRAMUSCULAR | Status: DC | PRN
  Administered 2012-03-30: 30 mg via INTRAMUSCULAR

## 2012-03-30 MED ORDER — OXYTOCIN 10 UNIT/ML IJ SOLN
INTRAMUSCULAR | Status: AC
  Filled 2012-03-30: qty 4

## 2012-03-30 MED ORDER — DIPHENHYDRAMINE HCL 50 MG/ML IJ SOLN: 12.5000 mg | INTRAMUSCULAR | Status: DC | PRN

## 2012-03-30 MED ORDER — SIMETHICONE 80 MG PO CHEW
80.0000 mg | CHEWABLE_TABLET | Freq: Three times a day (TID) | ORAL | Status: DC
  Administered 2012-03-30 – 2012-04-01 (×6): 80 mg via ORAL

## 2012-03-30 MED ORDER — KETOROLAC TROMETHAMINE 30 MG/ML IJ SOLN
INTRAMUSCULAR | Status: AC
  Administered 2012-03-30: 30 mg via INTRAMUSCULAR
  Filled 2012-03-30: qty 1

## 2012-03-30 MED ORDER — NALOXONE HCL 0.4 MG/ML IJ SOLN: 0.4000 mg | INTRAMUSCULAR | Status: DC | PRN

## 2012-03-30 MED ORDER — KETOROLAC TROMETHAMINE 30 MG/ML IJ SOLN
30.0000 mg | Freq: Four times a day (QID) | INTRAMUSCULAR | Status: DC | PRN
  Administered 2012-03-30 – 2012-03-31 (×2): 30 mg via INTRAVENOUS
  Filled 2012-03-30 (×2): qty 1

## 2012-03-30 MED ORDER — SODIUM CHLORIDE 0.9 % IV SOLN: 1.0000 ug/kg/h | INTRAVENOUS | Status: DC | PRN

## 2012-03-30 MED ORDER — NALBUPHINE SYRINGE 5 MG/0.5 ML
INJECTION | INTRAMUSCULAR | Status: AC
  Administered 2012-03-30: 10 mg via SUBCUTANEOUS
  Filled 2012-03-30: qty 1

## 2012-03-30 MED ORDER — ONDANSETRON HCL 4 MG/2ML IJ SOLN
INTRAMUSCULAR | Status: AC
  Filled 2012-03-30: qty 2

## 2012-03-30 MED ORDER — MIDAZOLAM HCL 2 MG/2ML IJ SOLN: 0.5000 mg | Freq: Once | INTRAMUSCULAR | Status: DC | PRN

## 2012-03-30 MED ORDER — OXYCODONE-ACETAMINOPHEN 5-325 MG PO TABS
1.0000 | ORAL_TABLET | ORAL | Status: DC | PRN
Start: 2012-03-30 — End: 2012-04-01
  Administered 2012-03-31: 2 via ORAL
  Administered 2012-03-31 (×3): 1 via ORAL
  Administered 2012-04-01 (×2): 2 via ORAL
  Filled 2012-03-30: qty 1
  Filled 2012-03-30 (×3): qty 2
  Filled 2012-03-30 (×2): qty 1

## 2012-03-30 MED ORDER — LACTATED RINGERS IV SOLN
INTRAVENOUS | Status: DC | PRN
  Administered 2012-03-30 (×3): via INTRAVENOUS

## 2012-03-30 MED ORDER — LANOLIN HYDROUS EX OINT: 1.0000 "application " | TOPICAL_OINTMENT | CUTANEOUS | Status: DC | PRN

## 2012-03-30 MED ORDER — WITCH HAZEL-GLYCERIN EX PADS: 1.0000 "application " | MEDICATED_PAD | CUTANEOUS | Status: DC | PRN

## 2012-03-30 MED ORDER — ONDANSETRON HCL 4 MG/2ML IJ SOLN: 4.0000 mg | INTRAMUSCULAR | Status: DC | PRN

## 2012-03-30 MED ORDER — BUPROPION HCL 75 MG PO TABS
75.0000 mg | ORAL_TABLET | Freq: Two times a day (BID) | ORAL | Status: DC
  Administered 2012-03-31 – 2012-04-01 (×3): 75 mg via ORAL
  Filled 2012-03-30 (×6): qty 1

## 2012-03-30 MED ORDER — LACTATED RINGERS IV SOLN
INTRAVENOUS | Status: DC
  Administered 2012-03-30: 10:00:00 via INTRAVENOUS

## 2012-03-30 MED ORDER — NALBUPHINE HCL 10 MG/ML IJ SOLN
5.0000 mg | INTRAMUSCULAR | Status: DC | PRN
  Administered 2012-03-30: 10 mg via SUBCUTANEOUS
  Filled 2012-03-30: qty 1

## 2012-03-30 MED ORDER — DIPHENHYDRAMINE HCL 50 MG/ML IJ SOLN
INTRAMUSCULAR | Status: AC
  Filled 2012-03-30: qty 1

## 2012-03-30 MED ORDER — MORPHINE SULFATE (PF) 0.5 MG/ML IJ SOLN
INTRAMUSCULAR | Status: DC | PRN
  Administered 2012-03-30: .1 mg via INTRATHECAL

## 2012-03-30 MED ORDER — DIPHENHYDRAMINE HCL 25 MG PO CAPS: 25.0000 mg | ORAL_CAPSULE | ORAL | Status: DC | PRN

## 2012-03-30 MED ORDER — METOCLOPRAMIDE HCL 5 MG/ML IJ SOLN: 10.0000 mg | Freq: Three times a day (TID) | INTRAMUSCULAR | Status: DC | PRN

## 2012-03-30 MED ORDER — ONDANSETRON HCL 4 MG PO TABS: 4.0000 mg | ORAL_TABLET | ORAL | Status: DC | PRN

## 2012-03-30 MED ORDER — FENTANYL CITRATE 0.05 MG/ML IJ SOLN: 25.0000 ug | INTRAMUSCULAR | Status: DC | PRN

## 2012-03-30 MED ORDER — DIPHENHYDRAMINE HCL 50 MG/ML IJ SOLN: 25.0000 mg | INTRAMUSCULAR | Status: DC | PRN

## 2012-03-30 MED ORDER — ZOLPIDEM TARTRATE 5 MG PO TABS: 5.0000 mg | ORAL_TABLET | Freq: Every evening | ORAL | Status: DC | PRN

## 2012-03-30 MED ORDER — PHENYLEPHRINE 40 MCG/ML (10ML) SYRINGE FOR IV PUSH (FOR BLOOD PRESSURE SUPPORT)
PREFILLED_SYRINGE | INTRAVENOUS | Status: AC
  Filled 2012-03-30: qty 5

## 2012-03-30 MED ORDER — SCOPOLAMINE 1 MG/3DAYS TD PT72: 1.0000 | MEDICATED_PATCH | Freq: Once | TRANSDERMAL | Status: DC

## 2012-03-30 MED ORDER — MORPHINE SULFATE 0.5 MG/ML IJ SOLN
INTRAMUSCULAR | Status: AC
  Filled 2012-03-30: qty 10

## 2012-03-30 MED ORDER — DIBUCAINE 1 % RE OINT: 1.0000 "application " | TOPICAL_OINTMENT | RECTAL | Status: DC | PRN

## 2012-03-30 MED ORDER — DIPHENHYDRAMINE HCL 50 MG/ML IJ SOLN
INTRAMUSCULAR | Status: DC | PRN
  Administered 2012-03-30: 12.5 mg via INTRAVENOUS

## 2012-03-30 MED ORDER — OXYTOCIN 10 UNIT/ML IJ SOLN
40.0000 [IU] | INTRAVENOUS | Status: DC | PRN
  Administered 2012-03-30: 40 [IU] via INTRAVENOUS

## 2012-03-30 MED ORDER — MENTHOL 3 MG MT LOZG: 1.0000 | LOZENGE | OROMUCOSAL | Status: DC | PRN

## 2012-03-30 MED ORDER — METHYLERGONOVINE MALEATE 0.2 MG PO TABS: 0.2000 mg | ORAL_TABLET | ORAL | Status: DC | PRN

## 2012-03-30 MED ORDER — FENTANYL CITRATE 0.05 MG/ML IJ SOLN
INTRAMUSCULAR | Status: AC
  Filled 2012-03-30: qty 2

## 2012-03-30 MED ORDER — BUPIVACAINE IN DEXTROSE 0.75-8.25 % IT SOLN
INTRATHECAL | Status: DC | PRN
  Administered 2012-03-30: 1.5 mL via INTRATHECAL

## 2012-03-30 MED ORDER — LACTATED RINGERS IV SOLN
INTRAVENOUS | Status: DC | PRN
  Administered 2012-03-30: 11:00:00 via INTRAVENOUS

## 2012-03-30 MED ORDER — METHYLERGONOVINE MALEATE 0.2 MG/ML IJ SOLN: 0.2000 mg | INTRAMUSCULAR | Status: DC | PRN

## 2012-03-30 MED ORDER — SIMETHICONE 80 MG PO CHEW: 80.0000 mg | CHEWABLE_TABLET | ORAL | Status: DC | PRN

## 2012-03-30 MED ORDER — TETANUS-DIPHTH-ACELL PERTUSSIS 5-2.5-18.5 LF-MCG/0.5 IM SUSP
0.5000 mL | Freq: Once | INTRAMUSCULAR | Status: AC
  Administered 2012-03-31: 0.5 mL via INTRAMUSCULAR

## 2012-03-30 SURGICAL SUPPLY — 32 items
ADH SKN CLS APL DERMABOND .7 (GAUZE/BANDAGES/DRESSINGS) ×1
CLIP FILSHIE TUBAL LIGA STRL (Clip) ×1 IMPLANT
CLOTH BEACON ORANGE TIMEOUT ST (SAFETY) ×2 IMPLANT
DERMABOND ADVANCED (GAUZE/BANDAGES/DRESSINGS) ×1
DERMABOND ADVANCED .7 DNX12 (GAUZE/BANDAGES/DRESSINGS) IMPLANT
DRESSING TELFA 8X3 (GAUZE/BANDAGES/DRESSINGS) ×2 IMPLANT
DRSG COVADERM 4X10 (GAUZE/BANDAGES/DRESSINGS) IMPLANT
ELECT REM PT RETURN 9FT ADLT (ELECTROSURGICAL) ×2
ELECTRODE REM PT RTRN 9FT ADLT (ELECTROSURGICAL) ×1 IMPLANT
EXTRACTOR VACUUM M CUP 4 TUBE (SUCTIONS) ×1 IMPLANT
GAUZE SPONGE 4X4 12PLY STRL LF (GAUZE/BANDAGES/DRESSINGS) ×4 IMPLANT
GLOVE BIO SURGEON STRL SZ7 (GLOVE) ×4 IMPLANT
GOWN PREVENTION PLUS LG XLONG (DISPOSABLE) ×4 IMPLANT
KIT ABG SYR 3ML LUER SLIP (SYRINGE) IMPLANT
NDL HYPO 25X5/8 SAFETYGLIDE (NEEDLE) IMPLANT
NEEDLE HYPO 25X5/8 SAFETYGLIDE (NEEDLE) IMPLANT
NS IRRIG 1000ML POUR BTL (IV SOLUTION) ×2 IMPLANT
PACK C SECTION WH (CUSTOM PROCEDURE TRAY) ×2 IMPLANT
PAD ABD 7.5X8 STRL (GAUZE/BANDAGES/DRESSINGS) ×2 IMPLANT
PAD OB MATERNITY 4.3X12.25 (PERSONAL CARE ITEMS) IMPLANT
RTRCTR C-SECT PINK 25CM LRG (MISCELLANEOUS) IMPLANT
RTRCTR C-SECT PINK 34CM XLRG (MISCELLANEOUS) IMPLANT
SLEEVE SCD COMPRESS KNEE MED (MISCELLANEOUS) IMPLANT
STAPLER VISISTAT 35W (STAPLE) IMPLANT
SUT CHROMIC 1 CTX 36 (SUTURE) ×4 IMPLANT
SUT PDS AB 0 CTX 60 (SUTURE) ×2 IMPLANT
SUT VIC AB 2-0 CT1 27 (SUTURE) ×2
SUT VIC AB 2-0 CT1 TAPERPNT 27 (SUTURE) ×1 IMPLANT
SUT VIC AB 4-0 KS 27 (SUTURE) IMPLANT
TOWEL OR 17X24 6PK STRL BLUE (TOWEL DISPOSABLE) ×4 IMPLANT
TRAY FOLEY CATH 14FR (SET/KITS/TRAYS/PACK) ×2 IMPLANT
WATER STERILE IRR 1000ML POUR (IV SOLUTION) ×1 IMPLANT

## 2012-03-30 NOTE — Op Note (Signed)
Pre-Operative Diagnosis:1) 39+0 week intrauterine pregnancy 2) H/o 2 prior cesarean sections 3) Desired permanent sterilization Postoperative Diagnosis: Same Procedure: Repeat low transverse cesarean section with bilateral tubal ligation with filshie clips Surgeon: Dr. Waynard Reeds Assistant: Dr. Luvenia Redden Operative Findings: Vigorous female fetus in vertex presentation with apgars of 9&9. Normal ovaries, tubes, and uterus. Specimen: Placenta for disposal EBL: Total I/O In: 2700 [I.V.:2700] Out: 750 [Urine:150; Blood:600]   Procedure:[name] is an 26 year old gravida 3 para 3 at 28 weeks and 0 days estimated gestational age who presents for cesarean section. Following the appropriate informed consent the patient was brought to the operating room where spinal anesthesia was administered and found to be adequate. She was placed in the dorsal supine position with a leftward tilt. She was prepped and draped in the normal sterile fashion. Scalpel was then used to make a Pfannenstiel skin incision which was carried down to the underlying layers of soft tissue to the fascia. The fascia was incised in the midline and the fascial incision was extended laterally with Mayo scissors. The superior aspect of the fascial incision was grasped with Coker clamps x2, tented up and the rectus muscles dissected off sharply with the electrocautery unit area and the same procedure was repeated on the inferior aspect of the fascial incision. The rectus muscles were separated in the midline. The abdominal peritoneum was identified, tented up, entered sharply, and the incision was extended superiorly and inferiorly with good visualization of the bladder. The Alexis retractor was then deployed. The vesicouterine peritoneum was identified, tented up, entered sharply, and the bladder flap was created digitally. Scalpel was then used to make a low transverse incision on the uterus which was extended laterally with both blunt  dissection and the bandage scissors. The fetal vertex was identified, delivered easily through the uterine incision followed by the body. The infant was bulb suctioned on the operative field cried vigorously, cord was clamped and cut and the infant was passed to the waiting neonatologist. Placenta was then delivered spontaneously, the uterus was cleared of all clot and debris. The uterine incision was repaired with #1 chromic in running locked fashion. Ovaries and tubes were inspected and normal. The Alexis retractor was removed. The uterus was returned to the abdominal cavity the abdominal cavity was cleared of all clot and debris. The abdominal peritoneum was reapproximated with 2-0 Vicryl in a running fashion, the rectus muscles was reapproximated with #1 chromic in a running fashion. The fascia was closed with a looped PDS in a running fashion. The subcutaneous layer was reapproximated with 2-0 plain gut with interrupted suture. The skin was closed with 4-0 vicryl in a subcuticular fashion and Dermabond. All sponge lap and needle counts were correct x2. Patient tolerated the procedure well and recovered in stable condition following the procedure.

## 2012-03-30 NOTE — Interval H&P Note (Signed)
History and Physical Interval Note:  03/30/2012 10:17 AM  [name]  has presented today for surgery, with the diagnosis of REPEAT DESIRES STERILZATION  The various methods of treatment have been discussed with the patient and family. After consideration of risks, benefits and other options for treatment, the patient has consented to  Procedure(s) (LRB): CESAREAN SECTION WITH BILATERAL TUBAL LIGATION (Bilateral) as a surgical intervention .  The patient's history has been reviewed, patient examined, no change in status, stable for surgery.  I have reviewed the patient's chart and labs.  Questions were answered to the patient's satisfaction.     [name]

## 2012-03-30 NOTE — Transfer of Care (Signed)
Immediate Anesthesia Transfer of Care Note  Patient: [name]  Procedure(s) Performed: Procedure(s) (LRB): CESAREAN SECTION WITH BILATERAL TUBAL LIGATION (Bilateral)  Patient Location: PACU  Anesthesia Type: Spinal  Level of Consciousness: awake and alert   Airway & Oxygen Therapy: Patient Spontanous Breathing  Post-op Assessment: Report given to PACU RN and Post -op Vital signs reviewed and stable  Post vital signs: Reviewed  Complications: No apparent anesthesia complications

## 2012-03-30 NOTE — Addendum Note (Signed)
Addendum  created 03/30/12 1733 by Graciela Husbands, CRNA   Modules edited:Notes Section

## 2012-03-30 NOTE — Anesthesia Postprocedure Evaluation (Signed)
Anesthesia Post Note  Patient: [name]  Procedure(s) Performed: Procedure(s) (LRB): CESAREAN SECTION WITH BILATERAL TUBAL LIGATION (Bilateral)  Anesthesia type: Spinal  Patient location: PACU  Post pain: Pain level controlled  Post assessment: Post-op Vital signs reviewed  Last Vitals:  Filed Vitals:   03/30/12 1334  BP: 117/71  Pulse:   Temp: 36.7 C  Resp: 18    Post vital signs: Reviewed  Level of consciousness: awake  Complications: No apparent anesthesia complications

## 2012-03-30 NOTE — H&P (Signed)
[name] is a 26 y.o. female presenting for repeat cesarean section and bilateral tubal ligation 26 yo G3P2002 presents to repeat cesarean section.  At her last cesarean a window in the LUS was noted. At that time she was advised to not have any additional pregnancies.  This was unplanned.  She did have some preterm contractions at 34 weeks and was placed on procardia. This was d/cd at 35 weeks History OB History    Grav Para Term Preterm Abortions TAB SAB Ect Mult Living   3 2 2       2      Past Medical History  Diagnosis Date  . Depression   . Anxiety   . Headache   . Abnormal Pap smear   . Gonorrhea   . HPV (human papilloma virus) infection     age 6  . Migraines   . Hx of scarlet fever 1996  . PVC (premature ventricular contraction)     history of pvc's-started in early preg 5/13 saw [place] -told to reduce caffeine intake-pvc's have stopped  . GERD (gastroesophageal reflux disease)     pregnancy related indigestion-tums prn   Past Surgical History  Procedure Date  . Cesarean section 2008, 2009  . 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 Wisdom tooth extraction    Family History: family history includes Cancer in her father; Heart attack in her father; Heart disease in her father and mother; Heart murmur in her mother; Hypertension in her father; Liver cancer in her father; and Lung cancer in her father.  There is no history of Anesthesia problems, and Hypotension, and Malignant hyperthermia, and Pseudochol deficiency, and Hearing loss, . Social History:  reports that she has been smoking Cigarettes.  She has a 2 pack-year smoking history. She has never used smokeless tobacco. She reports that she does not drink alcohol or use illicit drugs.   Prenatal Transfer Tool  Maternal Diabetes: No Genetic Screening: Declined Maternal Ultrasounds/Referrals: Normal Fetal Ultrasounds or other Referrals:  None Maternal Substance Abuse:  Yes:  Type: Smoker, Other:    Significant Maternal Medications:  None Significant Maternal Lab Results:  None Other Comments:  None  ROS    Blood pressure 129/81, pulse 86, temperature 98.4 F (36.9 C), temperature source Oral, resp. rate 18, height 5\' 3"  (1.6 m), weight 99.791 kg (220 lb), last menstrual period 06/29/2011, SpO2 98.00%. Exam Physical Exam  Prenatal labs: ABO, Rh: --/--/O POS (08/21 1104) Antibody: NEG (08/21 1104) Rubella:  Immune RPR: NON REACTIVE (08/21 1104)  HBsAg: Negative (01/15 0000)  HIV: Non-reactive (01/15 0000)  GBS:   negative  Assessment/Plan: Repeat cesarean section with bilateral tubal ligation. R/B/A of procedure were discussed at length and she wishes to proceed.   Kenedee Molesky H. 03/30/2012, 10:10 AM

## 2012-03-30 NOTE — Consult Note (Addendum)
Neonatology Note:   Attendance at C-section:    I was asked to attend this repeat C/S at term. The mother is a G3P2 O pos, GBS neg smoker with depression and history of HPV. ROM at delivery, fluid clear. Vacuum-assisted delivery.  Infant vigorous with good spontaneous cry and tone. Needed only minimal bulb suctioning. Ap 9/10. Lungs clear to ausc in DR. To CN to care of Pediatrician.   Deatra James, MD

## 2012-03-30 NOTE — Anesthesia Postprocedure Evaluation (Signed)
  Anesthesia Post-op Note  Patient: [name]  Procedure(s) Performed: Procedure(s) (LRB): CESAREAN SECTION WITH BILATERAL TUBAL LIGATION (Bilateral)  Patient Location: Mother/Baby  Anesthesia Type: Spinal  Level of Consciousness: awake, alert  and oriented  Airway and Oxygen Therapy: Patient Spontanous Breathing  Post-op Pain: mild  Post-op Assessment: Post-op Vital signs reviewed, Patient's Cardiovascular Status Stable, Pain level controlled, No headache, No backache, No residual numbness and No residual motor weakness  Post-op Vital Signs: Reviewed and stable  Complications: No apparent anesthesia complications

## 2012-03-30 NOTE — Anesthesia Procedure Notes (Signed)
Spinal  Patient location during procedure: OR Start time: 03/30/2012 11:07 AM Staffing Anesthesiologist: Brayton Caves R Performed by: anesthesiologist  Preanesthetic Checklist Completed: patient identified, site marked, surgical consent, pre-op evaluation, timeout performed, IV checked, risks and benefits discussed and monitors and equipment checked Spinal Block Patient position: sitting Prep: DuraPrep Patient monitoring: heart rate, cardiac monitor, continuous pulse ox and blood pressure Approach: midline Location: L3-4 Injection technique: single-shot Needle Needle type: Sprotte  Needle gauge: 24 G Needle length: 9 cm Assessment Sensory level: T4 Additional Notes Patient identified.  Risk benefits discussed including failed block, incomplete pain control, headache, nerve damage, paralysis, blood pressure changes, nausea, vomiting, reactions to medication both toxic or allergic, and postpartum back pain.  Patient expressed understanding and wished to proceed.  All questions were answered.  Sterile technique used throughout procedure.  CSF was clear.  No parasthesia or other complications.  Please see nursing notes for vital signs.

## 2012-03-30 NOTE — Anesthesia Preprocedure Evaluation (Signed)
Anesthesia Evaluation  Patient identified by MRN, date of birth, ID band Patient awake    Reviewed: Allergy & Precautions, H&P , NPO status , Patient's Chart, lab work & pertinent test results  Airway Mallampati: III      Dental No notable dental hx.    Pulmonary neg pulmonary ROS,  breath sounds clear to auscultation  Pulmonary exam normal       Cardiovascular Exercise Tolerance: Good negative cardio ROS  + dysrhythmias Rhythm:regular Rate:Normal     Neuro/Psych  Headaches, PSYCHIATRIC DISORDERS negative neurological ROS  negative psych ROS   GI/Hepatic negative GI ROS, Neg liver ROS, GERD-  ,  Endo/Other  negative endocrine ROS  Renal/GU negative Renal ROS  negative genitourinary   Musculoskeletal   Abdominal Normal abdominal exam  (+)   Peds  Hematology negative hematology ROS (+)   Anesthesia Other Findings Depression     Anxiety        Headache     Abnormal Pap smear        Gonorrhea     HPV (human papilloma virus) infection   age 26    Migraines     Hx of scarlet fever 1996      PVC (premature ventricular contraction)   history of pvc's-started in early preg 5/13 saw [place] -told to reduce caffeine intake-pvc's have stopped GERD (gastroesophageal reflux disease)   pregnancy related indigestion-tums prn    Reproductive/Obstetrics (+) Pregnancy                           Anesthesia Physical Anesthesia Plan  ASA: II  Anesthesia Plan: Spinal   Post-op Pain Management:    Induction:   Airway Management Planned:   Additional Equipment:   Intra-op Plan:   Post-operative Plan:   Informed Consent: I have reviewed the patients History and Physical, chart, labs and discussed the procedure including the risks, benefits and alternatives for the proposed anesthesia with the patient or authorized representative who has indicated his/her understanding and acceptance.     Plan  Discussed with: Anesthesiologist, CRNA and Surgeon  Anesthesia Plan Comments:         Anesthesia Quick Evaluation

## 2012-03-31 LAB — CBC
HCT: 30.7 % — ABNORMAL LOW (ref 36.0–46.0)
MCH: 32.1 pg (ref 26.0–34.0)
MCHC: 34.5 g/dL (ref 30.0–36.0)
MCV: 93 fL (ref 78.0–100.0)
RDW: 12.8 % (ref 11.5–15.5)
WBC: 7.8 10*3/uL (ref 4.0–10.5)

## 2012-03-31 LAB — CCBB MATERNAL DONOR DRAW

## 2012-03-31 NOTE — Progress Notes (Signed)
Subjective: Postpartum Day 1: Cesarean Delivery Patient reports incisional pain, tolerating PO, + flatus and no problems voiding.    Objective: Vital signs in last 24 hours: Temp:  [97.7 F (36.5 C)-98.6 F (37 C)] 97.7 F (36.5 C) (08/29 0630) Pulse Rate:  [60-93] 85  (08/29 0630) Resp:  [18-20] 20  (08/29 0630) BP: (96-132)/(56-82) 122/71 mmHg (08/29 0630) SpO2:  [96 %-100 %] 96 % (08/29 0630)  Physical Exam:  General: alert, cooperative and appears stated age Lochia: appropriate Uterine Fundus: firm Incision: healing well  Basename 03/31/12 0518  HGB 10.6*  HCT 30.7*    Assessment/Plan: Status post Cesarean section. Doing well postoperatively.  Continue current care Desires neonatal circ, R/B/A reviewed. Penis with small meatus, however should be able to have circ.  Mikhai Bienvenue H. 03/31/2012, 10:09 AM

## 2012-04-01 MED ORDER — OXYCODONE-ACETAMINOPHEN 5-325 MG PO TABS: 1.0000 | ORAL_TABLET | ORAL | Status: AC | PRN

## 2012-04-01 NOTE — Discharge Summary (Signed)
Obstetric Discharge Summary Reason for Admission: cesarean section Prenatal Procedures: none Intrapartum Procedures: cesarean: low cervical, transverse and tubal ligation Postpartum Procedures: none Complications-Operative and Postpartum: none Hemoglobin  Date Value Range Status  03/31/2012 10.6* 12.0 - 15.0 g/dL Final     HCT  Date Value Range Status  03/31/2012 30.7* 36.0 - 46.0 % Final    Discharge Diagnoses: Term Pregnancy-delivered  Discharge Information: Date: 04/01/2012 Activity: pelvic rest Diet: routine Medications: Percocet Condition: stable Instructions: refer to practice specific booklet Discharge to: home Follow-up Information    Follow up with Almon Hercules., MD. Schedule an appointment as soon as possible for a visit in 4 weeks.   Contact information:   [address] [place] [address] [phone]          Newborn Data: Live born female  Birth Weight: 7 lb 13.4 oz (3555 g) APGAR: , 10  Home with mother.  Mckenley Birenbaum A 04/01/2012, 8:23 AM

## 2012-04-01 NOTE — Progress Notes (Signed)
  Patient is eating, ambulating, voiding.  Pain control is good.  Filed Vitals:   03/31/12 0630 03/31/12 1500 03/31/12 2130 04/01/12 0504  BP: 122/71 118/72 118/73 127/67  Pulse: 85 86 84 107  Temp: 97.7 F (36.5 C) 97.9 F (36.6 C) 98.3 F (36.8 C) 97.9 F (36.6 C)  TempSrc: Oral Oral Oral Oral  Resp: 20 20 20 18   Height:      Weight:      SpO2: 96%  97%     lungs:   clear to auscultation cor:    RRR Abdomen:  soft, appropriate tenderness, incisions intact and without erythema or exudate ex:    no cords   Lab Results  Component Value Date   WBC 7.8 03/31/2012   HGB 10.6* 03/31/2012   HCT 30.7* 03/31/2012   MCV 93.0 03/31/2012   PLT 182 03/31/2012    --/--/O POS (08/28 0950)/RI  A/P    Post operative day 2.  Routine post op and postpartum care.  Expect d/c per routine.  Percocet for pain control.

## 2012-04-11 ENCOUNTER — Encounter (HOSPITAL_COMMUNITY): Payer: Self-pay | Admitting: *Deleted

## 2012-10-03 ENCOUNTER — Ambulatory Visit: Payer: BC Managed Care – PPO

## 2012-10-03 ENCOUNTER — Ambulatory Visit (INDEPENDENT_AMBULATORY_CARE_PROVIDER_SITE_OTHER): Payer: BC Managed Care – PPO | Admitting: Family Medicine

## 2012-10-03 VITALS — BP 144/86 | HR 84 | Temp 97.9°F | Resp 18 | Ht 64.25 in | Wt 217.2 lb

## 2012-10-03 DIAGNOSIS — M7989 Other specified soft tissue disorders: Secondary | ICD-10-CM

## 2012-10-03 LAB — POCT CBC
Granulocyte percent: 58.9 %G (ref 37–80)
HCT, POC: 40.7 % (ref 37.7–47.9)
Lymph, poc: 2.4 (ref 0.6–3.4)
MCH, POC: 27.9 pg (ref 27–31.2)
MCV: 89.4 fL (ref 80–97)
MID (cbc): 0.6 (ref 0–0.9)
POC LYMPH PERCENT: 33.2 %L (ref 10–50)
Platelet Count, POC: 364 10*3/uL (ref 142–424)
RDW, POC: 13.9 %
WBC: 7.1 10*3/uL (ref 4.6–10.2)

## 2012-10-03 NOTE — Patient Instructions (Addendum)
If your hand starts getting worse tonight please either call me or go to the ER if your symptoms seem severe.  If your symptoms persist we will do an ultrasound for you to make sure there is no blood clot in your arm.

## 2012-10-03 NOTE — Progress Notes (Signed)
[facility] [address], [place] [address] [phone]  Date:  10/03/2012   Name:  [name]   DOB:  May 19, 1986   MRN:  [id number]  PCP:  Joanna Adolph, MD    Chief Complaint: right hand pain   History of Present Illness:  [name] is a 27 y.o. very pleasant female patient who presents with the following:  She noted that her right hand and forearm was sore today- there was no known injury.  She had thought that the hand was blue, but that turned out to be dye from her new jeans.  This has never happened to her in the past.  The soreness is not severe, like "an ache."  She also noted that her hand is slightly swollen and her ring is tight.  She has no numbness or sensation change of the hand. No tingling or throbbing.    She has never had a DVT or PE, she is not on OCP, smokes 2 or 3 cigarettes a day Recently delivered a baby,  Not currently breastfeeding.    Patient Active Problem List  Diagnosis  . Heart palpitations  . Round ligament pain  . Back pain in pregnancy    Past Medical History  Diagnosis Date  . Depression   . Anxiety   . Headache   . Abnormal Pap smear   . Gonorrhea   . HPV (human papilloma virus) infection     age 39  . Migraines   . Hx of scarlet fever 1996  . PVC (premature ventricular contraction)     history of pvc's-started in early preg 5/13 saw [name] -told to reduce caffeine intake-pvc's have stopped  . GERD (gastroesophageal reflux disease)     pregnancy related indigestion-tums prn    Past Surgical History  Procedure Laterality Date  . Cesarean section  2008, 2009  . 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 Wisdom tooth extraction      History  Substance Use Topics  . Smoking status: Current Some Day Smoker -- 0.25 packs/day for 8 years    Types: Cigarettes  . Smokeless tobacco: Never Used  . Alcohol Use: No    Family History  Problem Relation Age of Onset  . Heart disease  Mother   . Heart murmur Mother   . Heart disease Father   . Hypertension Father   . Heart attack Father     twice  . Liver cancer Father   . Lung cancer Father     stage 4  . Cancer Father     lung and liver, dx 12/2011  . Anesthesia problems Neg Hx   . Hypotension Neg Hx   . Malignant hyperthermia Neg Hx   . Pseudochol deficiency Neg Hx   . Hearing loss Neg Hx     No Known Allergies  Medication list has been reviewed and updated.  Current Outpatient Prescriptions on File Prior to Visit  Medication Sig Dispense Refill  . buPROPion (WELLBUTRIN) 75 MG tablet Take 75 mg by mouth 2 (two) times daily. depression      . butalbital-acetaminophen-caffeine (FIORICET) 50-325-40 MG per tablet Take 1-2 tablets by mouth every 6 (six) hours as needed for headache.  20 tablet  0  . calcium carbonate (TUMS - DOSED IN MG ELEMENTAL CALCIUM) 500 MG chewable tablet Chew 1 tablet by mouth daily. For heartburn.      . diphenhydrAMINE (BENADRYL) 25 MG  tablet Take 25 mg by mouth as needed. Used for allergies      . ferrous fumarate (HEMOCYTE - 106 MG FE) 325 (106 FE) MG TABS Take 1 tablet by mouth daily.      . Prenatal Vit-Fe Fumarate-FA (PRENATAL MULTIVITAMIN) TABS Take 1 tablet by mouth daily.      . [DISCONTINUED] SUMAtriptan (IMITREX) 50 MG tablet Take 50 mg by mouth every 2 (two) hours as needed. Patient states that the physician informed her to stop this medication.       No current facility-administered medications on file prior to visit.    Review of Systems:  As per HPI- otherwise negative.   Physical Examination: Filed Vitals:   10/03/12 1438  BP: 144/86  Pulse: 84  Temp: 97.9 F (36.6 C)  Resp: 18   Filed Vitals:   10/03/12 1438  Height: 5' 4.25" (1.632 m)  Weight: 217 lb 3.2 oz (98.521 kg)   Body mass index is 36.99 kg/(m^2). Ideal Body Weight: Weight in (lb) to have BMI = 25: 146.5  GEN: WDWN, NAD, Non-toxic, A & O x 3, obese HEENT: Atraumatic, Normocephalic. Neck  supple. No masses, No LAD. Ears and Nose: No external deformity. CV: RRR, No M/G/R. No JVD. No thrill. No extra heart sounds. PULM: CTA B, no wheezes, crackles, rhonchi. No retractions. No resp. distress. No accessory muscle use. EXTR: No c/c/e NEURO Normal gait.  PSYCH: Normally interactive. Conversant. Not depressed or anxious appearing.  Calm demeanor.  Question minimal swelling of her right hand- we were able to remove her ring with some lotion.  She has slight tenderness in her right hand and forearm, but no point tenderness.  Normal ROM, excellent grip strength, strong radial pulse, no swelling or tenderness of her upper arm.  I am not able to appreciate any discoloration of her hand, she has good cap refill.  Shoulder with normal ROM   Results for orders placed in visit on 10/03/12  POCT CBC      Result Value Range   WBC 7.1  4.6 - 10.2 K/uL   Lymph, poc 2.4  0.6 - 3.4   POC LYMPH PERCENT 33.2  10 - 50 %L   MID (cbc) 0.6  0 - 0.9   POC MID % 7.9  0 - 12 %M   POC Granulocyte 4.2  2 - 6.9   Granulocyte percent 58.9  37 - 80 %G   RBC 4.55  4.04 - 5.48 M/uL   Hemoglobin 12.7  12.2 - 16.2 g/dL   HCT, POC 16.1  09.6 - 47.9 %   MCV 89.4  80 - 97 fL   MCH, POC 27.9  27 - 31.2 pg   MCHC 31.2 (*) 31.8 - 35.4 g/dL   RDW, POC 04.5     Platelet Count, POC 364  142 - 424 K/uL   MPV 7.8  0 - 99.8 fL   [facility] reading (PRIMARY) by  Dr. Patsy [name]. Right hand: negative   RIGHT HAND - 2 VIEW  Comparison: None  Findings: Osseous mineralization normal. Joint spaces preserved. No acute fracture, dislocation or bone destruction. No focal soft tissue abnormalities identified.  IMPRESSION: No acute abnormalities.   Assessment and Plan: Hand swelling - Plan: POCT CBC, Basic metabolic panel, DG Hand 2 View Right  [name] is here today with questionable swelling and discomfort in her right hand.  I am able to appreciate only questionable trace swelling, and she has strong pulses and cap refill  is  excellent.  Offered to have an ultrasound done today to rule- out a DVT of the upper extremity.  However, given her mild symptoms [name] would like to see how she does over the next 12- 24 hours.  She was mostly worried about the blue color of her hand, but felt better once she realized this was just from her jeans.  Will plan further follow- up pending labs. She will call or go to the ED if any worsening overnight   COPLAND,JESSICA, MD

## 2012-10-04 ENCOUNTER — Telehealth: Payer: Self-pay | Admitting: Family Medicine

## 2012-10-04 LAB — BASIC METABOLIC PANEL
BUN: 9 mg/dL (ref 6–23)
Creat: 0.54 mg/dL (ref 0.50–1.10)
Glucose, Bld: 102 mg/dL — ABNORMAL HIGH (ref 70–99)
Potassium: 3.9 mEq/L (ref 3.5–5.3)

## 2012-10-04 NOTE — Telephone Encounter (Signed)
Called her to check on her hand- no answer, but LMOM asking her to let us know if problem persists.  Also, her BMP looked fine (random sample, not fasting0 Results for orders placed in visit on 10/03/12  BASIC METABOLIC PANEL      Result Value Range   Sodium 140  135 - 145 mEq/L   Potassium 3.9  3.5 - 5.3 mEq/L   Chloride 105  96 - 112 mEq/L   CO2 25  19 - 32 mEq/L   Glucose, Bld 102 (*) 70 - 99 mg/dL   BUN 9  6 - 23 mg/dL   Creat 1.61  0.96 - 0.45 mg/dL   Calcium 9.4  8.4 - 40.9 mg/dL  POCT CBC      Result Value Range   WBC 7.1  4.6 - 10.2 K/uL   Lymph, poc 2.4  0.6 - 3.4   POC LYMPH PERCENT 33.2  10 - 50 %L   MID (cbc) 0.6  0 - 0.9   POC MID % 7.9  0 - 12 %M   POC Granulocyte 4.2  2 - 6.9   Granulocyte percent 58.9  37 - 80 %G   RBC 4.55  4.04 - 5.48 M/uL   Hemoglobin 12.7  12.2 - 16.2 g/dL   HCT, POC 81.1  91.4 - 47.9 %   MCV 89.4  80 - 97 fL   MCH, POC 27.9  27 - 31.2 pg   MCHC 31.2 (*) 31.8 - 35.4 g/dL   RDW, POC 78.2     Platelet Count, POC 364  142 - 424 K/uL   MPV 7.8  0 - 99.8 fL

## 2012-12-16 ENCOUNTER — Ambulatory Visit (INDEPENDENT_AMBULATORY_CARE_PROVIDER_SITE_OTHER): Payer: BC Managed Care – PPO | Admitting: Nurse Practitioner

## 2012-12-16 ENCOUNTER — Encounter: Payer: Self-pay | Admitting: Nurse Practitioner

## 2012-12-16 VITALS — BP 122/68 | HR 88 | Ht 64.0 in | Wt 215.0 lb

## 2012-12-16 DIAGNOSIS — G44209 Tension-type headache, unspecified, not intractable: Secondary | ICD-10-CM

## 2012-12-16 DIAGNOSIS — G43909 Migraine, unspecified, not intractable, without status migrainosus: Secondary | ICD-10-CM

## 2012-12-16 DIAGNOSIS — G43101 Migraine with aura, not intractable, with status migrainosus: Secondary | ICD-10-CM | POA: Insufficient documentation

## 2012-12-16 MED ORDER — RIZATRIPTAN BENZOATE 10 MG PO TBDP: 10.0000 mg | ORAL_TABLET | ORAL | Status: DC | PRN

## 2012-12-16 MED ORDER — TOPIRAMATE 25 MG PO CPSP: 25.0000 mg | ORAL_CAPSULE | ORAL | Status: DC

## 2012-12-16 NOTE — Progress Notes (Signed)
HPI: Patient returns for followup after her last visit 05/15/2011. She has a history of migraines associated with nausea and dizziness. Onset was at age 27 but worsened after her second pregnancy. She was on Topamax and stopped  the medication after her most recent pregnancy, she had a C-section 03/30/2012. She is anxious to get back on preventatives. Her migraines have a throbbing character and she feels as if her head is being mashed by an  outside force, it usually starts in the left temple area and radiates to the neck.  She also has tension headaches. Her migraine triggers in the past have been cured meats, MSG and aged cheeses. She is having several headaches per week, however she has not kept a migraine diary  ROS:  migraine  Physical Exam General: well developed, obese female seated, in no evident distress Head: head normocephalic and atraumatic. Oropharynx benign Neck: supple with no carotid or supraclavicular bruits Cardiovascular: regular rate and rhythm, no murmurs  Neurologic Exam Mental Status: Awake and fully alert. Oriented to place and time.  Mood and affect appropriate.  Cranial Nerves:  Pupils equal, briskly reactive to light. Extraocular movements full without nystagmus. Visual fields full to confrontation. Hearing intact and symmetric to finger snap. Facial sensation intact. Face, tongue, palate move normally and symmetrically. Neck flexion and extension normal.  Motor: Normal bulk and tone. Normal strength in all tested extremity muscles. Sensory.: intact to touch and pinprick and vibratory.  Coordination: Rapid alternating movements normal in all extremities. Finger-to-nose and heel-to-shin performed accurately bilaterally. Gait and Station: Arises from chair without difficulty. Stance is normal. Gait demonstrates normal stride length and balance . Able to heel, toe and tandem walk without difficulty.  Reflexes: 2+ and symmetric. Toes downgoing.     ASSESSMENT: Migraine  headaches previously controlled with Topamax. Patient off medication when she became pregnant with her third child. She now wants to get back on a preventive and she is having to 3 headaches per week. She has not kept a diary     PLAN: Start Topamax 25 mg at bedtime for one week then increase by 25 mg weekly until dose is 100 mg Maxalt MLT acutely for headache Reviewed all migraine triggers Followup in 6 months   Nilda Riggs, GNP-BC APRN

## 2012-12-16 NOTE — Patient Instructions (Addendum)
Start Topamax 25 mg at bedtime for one week then increase by 25 mg weekly until dose is 100 mg Maxalt MLT acutely for headache Followup in 6 months

## 2013-02-17 ENCOUNTER — Ambulatory Visit: Payer: BC Managed Care – PPO

## 2013-02-17 ENCOUNTER — Ambulatory Visit (INDEPENDENT_AMBULATORY_CARE_PROVIDER_SITE_OTHER): Payer: BC Managed Care – PPO | Admitting: Internal Medicine

## 2013-02-17 VITALS — BP 100/80 | HR 80 | Temp 97.7°F | Resp 18 | Ht 64.5 in | Wt 209.2 lb

## 2013-02-17 DIAGNOSIS — M545 Low back pain, unspecified: Secondary | ICD-10-CM

## 2013-02-17 DIAGNOSIS — M4317 Spondylolisthesis, lumbosacral region: Secondary | ICD-10-CM

## 2013-02-17 DIAGNOSIS — Z6835 Body mass index (BMI) 35.0-35.9, adult: Secondary | ICD-10-CM | POA: Insufficient documentation

## 2013-02-17 MED ORDER — MELOXICAM 15 MG PO TABS: 15.0000 mg | ORAL_TABLET | Freq: Every day | ORAL | Status: DC

## 2013-02-17 MED ORDER — CYCLOBENZAPRINE HCL 10 MG PO TABS: 10.0000 mg | ORAL_TABLET | Freq: Every day | ORAL | Status: DC

## 2013-02-17 MED ORDER — PREDNISONE 20 MG PO TABS: 20.0000 mg | ORAL_TABLET | Freq: Every day | ORAL | Status: DC

## 2013-02-17 NOTE — Progress Notes (Signed)
  Subjective:    Patient ID: [name], female    DOB: 10/31/1985, 27 y.o.   MRN: [id number]  HPI history of low back pain with radiation to the left leg over the past 4 months/getting worse Has both pain and numbness intermittently in the left leg but no weakness or change in gait No known injury but has 3 children ages 29 months to 6 years In nursing school [employer] Did have back pain during her pregnancies  Past medical history includes migraine headaches Current medications include Wellbutrin and Topamax   Review of Systems No fever chills or night sweats  No abdominal pain  No genitourinary symptoms     Objective:   Physical Exam BP 100/80  Pulse 80  Temp(Src) 97.7 F (36.5 C) (Oral)  Resp 18  Ht 5' 4.5" (1.638 m)  Wt 209 lb 3.2 oz (94.892 kg)  BMI 35.37 kg/m2  SpO2 98%  LMP 02/11/2013 HEENT clear Heart regular Tender palpation over the left lumbar area and left SI area Straight leg raise is positive at 80 on the left Deep tendon reflexes are symmetrical No sensory losses and lower extremities Gait normal    [facility] reading (PRIMARY) by  Dr.Cynthia Stainback= normal alignment with no narrow disc space and no bony lesions      Assessment & Plan:  Lumbosacra pain with mild radicular symptoms BMI 35   Meds ordered this encounter  Medications  . cyclobenzaprine (FLEXERIL) 10 MG tablet    Sig: Take 1 tablet (10 mg total) by mouth at bedtime.    Dispense:  30 tablet    Refill:  0  . predniSONE (DELTASONE) 20 MG tablet    Sig: Take 1 tablet (20 mg total) by mouth daily. 3/3/2/2/1/1 single daily dose for 6 days    Dispense:  12 tablet    Refill:  0  . meloxicam (MOBIC) 15 MG tablet    Sig: Take 1 tablet (15 mg total) by mouth daily.    Dispense:  30 tablet    Refill:  0   Exercises given with booklet for back education  Followup one month-? PT Physical conditioning will be part of this recovery process (with weight loss)

## 2013-02-19 ENCOUNTER — Encounter: Payer: Self-pay | Admitting: Internal Medicine

## 2013-02-28 ENCOUNTER — Encounter: Payer: Self-pay | Admitting: Internal Medicine

## 2013-04-10 ENCOUNTER — Encounter (HOSPITAL_COMMUNITY): Payer: Self-pay | Admitting: Emergency Medicine

## 2013-04-10 ENCOUNTER — Emergency Department (HOSPITAL_COMMUNITY)
Admission: EM | Admit: 2013-04-10 | Discharge: 2013-04-10 | Disposition: A | Discharge status: Home / Self Care | Payer: BC Managed Care – PPO | Attending: Emergency Medicine | Admitting: Emergency Medicine

## 2013-04-10 DIAGNOSIS — Z8619 Personal history of other infectious and parasitic diseases: Secondary | ICD-10-CM | POA: Insufficient documentation

## 2013-04-10 DIAGNOSIS — M5416 Radiculopathy, lumbar region: Secondary | ICD-10-CM

## 2013-04-10 DIAGNOSIS — X500XXA Overexertion from strenuous movement or load, initial encounter: Secondary | ICD-10-CM | POA: Insufficient documentation

## 2013-04-10 DIAGNOSIS — S39012A Strain of muscle, fascia and tendon of lower back, initial encounter: Secondary | ICD-10-CM

## 2013-04-10 DIAGNOSIS — F17200 Nicotine dependence, unspecified, uncomplicated: Secondary | ICD-10-CM | POA: Insufficient documentation

## 2013-04-10 DIAGNOSIS — G43909 Migraine, unspecified, not intractable, without status migrainosus: Secondary | ICD-10-CM | POA: Insufficient documentation

## 2013-04-10 DIAGNOSIS — F3289 Other specified depressive episodes: Secondary | ICD-10-CM | POA: Insufficient documentation

## 2013-04-10 DIAGNOSIS — Z8719 Personal history of other diseases of the digestive system: Secondary | ICD-10-CM | POA: Insufficient documentation

## 2013-04-10 DIAGNOSIS — F329 Major depressive disorder, single episode, unspecified: Secondary | ICD-10-CM | POA: Insufficient documentation

## 2013-04-10 DIAGNOSIS — F411 Generalized anxiety disorder: Secondary | ICD-10-CM | POA: Insufficient documentation

## 2013-04-10 DIAGNOSIS — Y92009 Unspecified place in unspecified non-institutional (private) residence as the place of occurrence of the external cause: Secondary | ICD-10-CM | POA: Insufficient documentation

## 2013-04-10 DIAGNOSIS — R209 Unspecified disturbances of skin sensation: Secondary | ICD-10-CM | POA: Insufficient documentation

## 2013-04-10 DIAGNOSIS — S335XXA Sprain of ligaments of lumbar spine, initial encounter: Secondary | ICD-10-CM | POA: Insufficient documentation

## 2013-04-10 DIAGNOSIS — IMO0002 Reserved for concepts with insufficient information to code with codable children: Secondary | ICD-10-CM | POA: Insufficient documentation

## 2013-04-10 DIAGNOSIS — Z8679 Personal history of other diseases of the circulatory system: Secondary | ICD-10-CM | POA: Insufficient documentation

## 2013-04-10 DIAGNOSIS — Y9389 Activity, other specified: Secondary | ICD-10-CM | POA: Insufficient documentation

## 2013-04-10 DIAGNOSIS — Z79899 Other long term (current) drug therapy: Secondary | ICD-10-CM | POA: Insufficient documentation

## 2013-04-10 MED ORDER — METHOCARBAMOL 500 MG PO TABS
500.0000 mg | ORAL_TABLET | Freq: Once | ORAL | Status: AC
  Administered 2013-04-10: 500 mg via ORAL
  Filled 2013-04-10: qty 1

## 2013-04-10 MED ORDER — METHOCARBAMOL 500 MG PO TABS: 500.0000 mg | ORAL_TABLET | Freq: Two times a day (BID) | ORAL | Status: DC

## 2013-04-10 MED ORDER — TRAMADOL HCL 50 MG PO TABS
50.0000 mg | ORAL_TABLET | Freq: Once | ORAL | Status: AC
  Administered 2013-04-10: 50 mg via ORAL
  Filled 2013-04-10: qty 1

## 2013-04-10 MED ORDER — TRAMADOL HCL 50 MG PO TABS: 50.0000 mg | ORAL_TABLET | Freq: Four times a day (QID) | ORAL | Status: DC | PRN

## 2013-04-10 MED ORDER — IBUPROFEN 600 MG PO TABS: 600.0000 mg | ORAL_TABLET | Freq: Four times a day (QID) | ORAL | Status: DC | PRN

## 2013-04-10 MED ORDER — IBUPROFEN 400 MG PO TABS
600.0000 mg | ORAL_TABLET | Freq: Once | ORAL | Status: AC
  Administered 2013-04-10: 600 mg via ORAL
  Filled 2013-04-10 (×2): qty 1

## 2013-04-10 NOTE — ED Notes (Signed)
Pt c/o lower back pain x 6 months 

## 2013-04-10 NOTE — ED Provider Notes (Signed)
CSN: [id number]     Arrival date & time 04/10/13  1641 History   First MD Initiated Contact with Patient 04/10/13 1749     Chief Complaint  Patient presents with  . Back Pain   (Consider location/radiation/quality/duration/timing/severity/associated sxs/prior Treatment) HPI Patient states she has had 6 months of low back pain has been intermittent. She had no definite inciting trauma. Patient does have several young children at home and states she does a lot of lifting. Occasionally she has radiation of her pain down her left leg with associated tingling. She denies any radiation currently. Patient states she was walking down stairs at school this morning when she had an acute exacerbation of her low back pain. She said it felt like her back was tightening. She has had no bladder or bowel incontinence. No fevers or chills. She has no focal weakness. No recent trauma. She has not seen a primary doctor or specialist about her low back pain.  Past Medical History  Diagnosis Date  . Depression   . Anxiety   . Headache(784.0)   . Abnormal Pap smear   . Gonorrhea   . HPV (human papilloma virus) infection     age 27  . Migraines   . Hx of scarlet fever 1996  . PVC (premature ventricular contraction)     history of pvc's-started in early preg 5/13 saw Piney -told to reduce caffeine intake-pvc's have stopped  . GERD (gastroesophageal reflux disease)     pregnancy related indigestion-tums prn   Past Surgical History  Procedure Laterality Date  . Cesarean section  2008, 2009  . 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 Wisdom tooth extraction     Family History  Problem Relation Age of Onset  . Heart disease Mother   . Heart murmur Mother   . Heart disease Father   . Hypertension Father   . Heart attack Father     twice  . Liver cancer Father   . Lung cancer Father     stage 4  . Cancer Father     lung and liver, dx 12/2011  . Anesthesia problems Neg Hx   . Hypotension  Neg Hx   . Malignant hyperthermia Neg Hx   . Pseudochol deficiency Neg Hx   . Hearing loss Neg Hx   . Asthma Daughter   . Asthma Son    History  Substance Use Topics  . Smoking status: Current Some Day Smoker -- 0.25 packs/day for 8 years    Types: Cigarettes  . Smokeless tobacco: Never Used  . Alcohol Use: No   OB History   Grav Para Term Preterm Abortions TAB SAB Ect Mult Living   3 3 3       3      Review of Systems  Constitutional: Negative for fever and chills.  HENT: Negative for neck pain.   Respiratory: Negative for shortness of breath.   Cardiovascular: Negative for chest pain.  Gastrointestinal: Negative for nausea, vomiting, abdominal pain and diarrhea.  Genitourinary: Negative for dysuria, flank pain and difficulty urinating.  Musculoskeletal: Positive for myalgias and back pain.  Skin: Negative for rash and wound.  Neurological: Positive for numbness. Negative for dizziness, weakness, light-headedness and headaches.  All other systems reviewed and are negative.    Allergies  Review of patient's allergies indicates no known allergies.  Home Medications   Current Outpatient Rx  Name  Route  Sig  Dispense  Refill  .  buPROPion (WELLBUTRIN XL) 300 MG 24 hr tablet   Oral   Take 300 mg by mouth daily.         . calcium carbonate (TUMS - DOSED IN MG ELEMENTAL CALCIUM) 500 MG chewable tablet   Oral   Chew 1 tablet by mouth daily. For heartburn.         . diphenhydrAMINE (BENADRYL) 25 MG tablet   Oral   Take 25 mg by mouth as needed. Used for allergies         . rizatriptan (MAXALT-MLT) 10 MG disintegrating tablet   Oral   Take 1 tablet (10 mg total) by mouth as needed for migraine. May repeat in 2 hours if needed   10 tablet   6   . Tranexamic Acid (LYSTEDA PO)   Oral   Take by mouth.         Marland Kitchen ibuprofen (ADVIL,MOTRIN) 600 MG tablet   Oral   Take 1 tablet (600 mg total) by mouth every 6 (six) hours as needed for pain.   30 tablet   0   .  methocarbamol (ROBAXIN) 500 MG tablet   Oral   Take 1 tablet (500 mg total) by mouth 2 (two) times daily.   20 tablet   0   . traMADol (ULTRAM) 50 MG tablet   Oral   Take 1 tablet (50 mg total) by mouth every 6 (six) hours as needed for pain.   15 tablet   0    BP 136/74  Pulse 77  Temp(Src) 98 F (36.7 C) (Oral)  Resp 18  SpO2 98% Physical Exam  Nursing note and vitals reviewed. Constitutional: She is oriented to person, place, and time. She appears well-developed and well-nourished. No distress.  Is doing homework in her room.  HENT:  Head: Normocephalic and atraumatic.  Mouth/Throat: Oropharynx is clear and moist.  Eyes: EOM are normal. Pupils are equal, round, and reactive to light.  Neck: Normal range of motion. Neck supple.  Cardiovascular: Normal rate and regular rhythm.   Pulmonary/Chest: Effort normal and breath sounds normal. No respiratory distress. She has no wheezes. She has no rales.  Abdominal: Soft. Bowel sounds are normal. She exhibits no distension and no mass. There is no tenderness. There is no rebound and no guarding.  Musculoskeletal: Normal range of motion. She exhibits tenderness (bilateral lumbar tenderness to palpation). She exhibits no edema.  Minimal midline lumbar tenderness. Lumbar left greater than right paraspinal spasm. Bilateral leg raise is negative. Distal pulses intact  Neurological: She is alert and oriented to person, place, and time.  Patient is alert and oriented x3 with clear, goal oriented speech. Patient has 5/5 motor in all extremities. Sensation is intact to light touch. No saddle anesthesia. Patient has a normal gait and walks without assistance.   Skin: Skin is warm and dry. No rash noted. No erythema.  Psychiatric: She has a normal mood and affect. Her behavior is normal.    ED Course  Procedures (including critical care time) Labs Review Labs Reviewed - No data to display Imaging Review No results found.  MDM   1.  Lumbar strain, initial encounter   2. Lumbar radiculopathy    Patient may have underlying lumbar radiculopathy given the history of radiation down her left leg. I believe her main concern today is paraspinal lumbar muscle spasm and strain. This may be due to repeated heavy lifting a small children. Patient been advised on proper lifting using her legs and less  bending forward. Will treat symptomatically with anti-inflammatories and muscle relaxants. Patient advised to use heat and ice on her back. Patient been given orthopedic contact information should the pain persist despite conservative measures. Return precautions have been given to the patient    Loren Racer, MD 04/10/13 [phone]

## 2013-05-18 ENCOUNTER — Other Ambulatory Visit: Payer: Self-pay | Admitting: Obstetrics and Gynecology

## 2013-06-03 ENCOUNTER — Encounter (HOSPITAL_COMMUNITY): Payer: Self-pay | Admitting: Emergency Medicine

## 2013-06-03 ENCOUNTER — Emergency Department (HOSPITAL_COMMUNITY)
Admission: EM | Admit: 2013-06-03 | Discharge: 2013-06-03 | Disposition: A | Discharge status: Home / Self Care | Payer: BC Managed Care – PPO | Attending: Emergency Medicine | Admitting: Emergency Medicine

## 2013-06-03 DIAGNOSIS — R42 Dizziness and giddiness: Secondary | ICD-10-CM | POA: Insufficient documentation

## 2013-06-03 DIAGNOSIS — F3289 Other specified depressive episodes: Secondary | ICD-10-CM | POA: Insufficient documentation

## 2013-06-03 DIAGNOSIS — Z8719 Personal history of other diseases of the digestive system: Secondary | ICD-10-CM | POA: Insufficient documentation

## 2013-06-03 DIAGNOSIS — F329 Major depressive disorder, single episode, unspecified: Secondary | ICD-10-CM | POA: Insufficient documentation

## 2013-06-03 DIAGNOSIS — Z8679 Personal history of other diseases of the circulatory system: Secondary | ICD-10-CM | POA: Insufficient documentation

## 2013-06-03 DIAGNOSIS — Z8619 Personal history of other infectious and parasitic diseases: Secondary | ICD-10-CM | POA: Insufficient documentation

## 2013-06-03 DIAGNOSIS — F17200 Nicotine dependence, unspecified, uncomplicated: Secondary | ICD-10-CM | POA: Insufficient documentation

## 2013-06-03 DIAGNOSIS — R519 Headache, unspecified: Secondary | ICD-10-CM

## 2013-06-03 DIAGNOSIS — G43909 Migraine, unspecified, not intractable, without status migrainosus: Secondary | ICD-10-CM | POA: Insufficient documentation

## 2013-06-03 DIAGNOSIS — Z79899 Other long term (current) drug therapy: Secondary | ICD-10-CM | POA: Insufficient documentation

## 2013-06-03 DIAGNOSIS — F411 Generalized anxiety disorder: Secondary | ICD-10-CM | POA: Insufficient documentation

## 2013-06-03 MED ORDER — DEXAMETHASONE SODIUM PHOSPHATE 10 MG/ML IJ SOLN
10.0000 mg | Freq: Once | INTRAMUSCULAR | Status: AC
  Administered 2013-06-03: 10 mg via INTRAVENOUS
  Filled 2013-06-03: qty 1

## 2013-06-03 MED ORDER — METOCLOPRAMIDE HCL 5 MG/ML IJ SOLN
10.0000 mg | Freq: Once | INTRAMUSCULAR | Status: AC
  Administered 2013-06-03: 10 mg via INTRAVENOUS
  Filled 2013-06-03: qty 2

## 2013-06-03 MED ORDER — DIPHENHYDRAMINE HCL 50 MG/ML IJ SOLN
25.0000 mg | Freq: Once | INTRAMUSCULAR | Status: AC
  Administered 2013-06-03: 25 mg via INTRAVENOUS
  Filled 2013-06-03: qty 1

## 2013-06-03 NOTE — ED Notes (Signed)
Alert, NAD, calm, interactive, "feels better".

## 2013-06-03 NOTE — ED Provider Notes (Signed)
Medical screening examination/treatment/procedure(s) were performed by non-physician practitioner and as supervising physician I was immediately available for consultation/collaboration.   Octavia Velador, MD 06/03/13 2300 

## 2013-06-03 NOTE — ED Notes (Signed)
Pt. reports migraine headache onset yesterday afternoon unrelieved by OTC Ibuprofen .

## 2013-06-03 NOTE — ED Notes (Signed)
C/o HA, similar to usual migraines, stopped topamax 3 weeks ago, ran out of maxalt, has tried ibuprofen. Rates HA 8/10. Also reports nausea, dizziness & light sensitivity, (denies: vd, fever), family at [facility].

## 2013-06-03 NOTE — ED Provider Notes (Signed)
CSN: [id number]     Arrival date & time 06/03/13  0028 History   First MD Initiated Contact with Patient 06/03/13 0203     Chief Complaint  Patient presents with  . Headache   (Consider location/radiation/quality/duration/timing/severity/associated sxs/prior Treatment) HPI Comments: Patient is a 27 year old female past medical history significant for migraines, depression, anxiety presented to emergency department for a moderate frontal headache with associated nausea and lightheadedness that began around 2 PM this afternoon. Patient endorses that this headache feels like previous headaches, stating that last time she had a fracture and lightheadedness was one month ago. Patient states she has tried to take ibuprofen at home with no relief. She denies findings pain, fever, numbness or weakness.   Patient is a 27 y.o. female presenting with headaches.  Headache Associated symptoms: nausea   Associated symptoms: no abdominal pain, no dizziness, no fever, no numbness and no vomiting     Past Medical History  Diagnosis Date  . Depression   . Anxiety   . Headache(784.0)   . Abnormal Pap smear   . Gonorrhea   . HPV (human papilloma virus) infection     age 27  . Migraines   . Hx of scarlet fever 1996  . PVC (premature ventricular contraction)     history of pvc's-started in early preg 5/13 saw [place] -told to reduce caffeine intake-pvc's have stopped  . GERD (gastroesophageal reflux disease)     pregnancy related indigestion-tums prn   Past Surgical History  Procedure Laterality Date  . Cesarean section  2008, 2009  . 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 Wisdom tooth extraction     Family History  Problem Relation Age of Onset  . Heart disease Mother   . Heart murmur Mother   . Heart disease Father   . Hypertension Father   . Heart attack Father     twice  . Liver cancer Father   . Lung cancer Father     stage 4  . Cancer Father     lung and liver, dx  12/2011  . Anesthesia problems Neg Hx   . Hypotension Neg Hx   . Malignant hyperthermia Neg Hx   . Pseudochol deficiency Neg Hx   . Hearing loss Neg Hx   . Asthma Daughter   . Asthma Son    History  Substance Use Topics  . Smoking status: Current Some Day Smoker -- 0.25 packs/day for 8 years    Types: Cigarettes  . Smokeless tobacco: Never Used  . Alcohol Use: No   OB History   Grav Para Term Preterm Abortions TAB SAB Ect Mult Living   3 3 3       3      Review of Systems  Constitutional: Negative for fever and chills.  Eyes: Negative.   Respiratory: Negative for shortness of breath.   Cardiovascular: Negative for chest pain.  Gastrointestinal: Positive for nausea. Negative for vomiting and abdominal pain.  Neurological: Positive for light-headedness and headaches. Negative for dizziness, syncope, weakness and numbness.  All other systems reviewed and are negative.    Allergies  Review of patient's allergies indicates no known allergies.  Home Medications   Current Outpatient Rx  Name  Route  Sig  Dispense  Refill  . buPROPion (WELLBUTRIN XL) 300 MG 24 hr tablet   Oral   Take 300 mg by mouth daily.         . calcium carbonate (TUMS -  DOSED IN MG ELEMENTAL CALCIUM) 500 MG chewable tablet   Oral   Chew 1 tablet by mouth daily. For heartburn.         . diphenhydrAMINE (BENADRYL) 25 MG tablet   Oral   Take 25 mg by mouth every 6 (six) hours as needed. Used for allergies         . ibuprofen (ADVIL,MOTRIN) 600 MG tablet   Oral   Take 1 tablet (600 mg total) by mouth every 6 (six) hours as needed for pain.   30 tablet   0   . methocarbamol (ROBAXIN) 500 MG tablet   Oral   Take 1 tablet (500 mg total) by mouth 2 (two) times daily.   20 tablet   0   . norethindrone-ethinyl estradiol (JUNEL FE,GILDESS FE,LOESTRIN FE) 1-20 MG-MCG tablet   Oral   Take 1 tablet by mouth daily.         . rizatriptan (MAXALT-MLT) 10 MG disintegrating tablet   Oral   Take 1  tablet (10 mg total) by mouth as needed for migraine. May repeat in 2 hours if needed   10 tablet   6   . traMADol (ULTRAM) 50 MG tablet   Oral   Take 1 tablet (50 mg total) by mouth every 6 (six) hours as needed for pain.   15 tablet   0   . Tranexamic Acid (LYSTEDA PO)   Oral   Take 2 tablets by mouth 3 (three) times daily as needed (to help slow menstrual flow).           BP 107/60  Pulse 72  Temp(Src) 98.3 F (36.8 C) (Oral)  Resp 18  Wt 210 lb (95.255 kg)  BMI 35.5 kg/m2  SpO2 100%  LMP 05/09/2013 Physical Exam  Constitutional: She is oriented to person, place, and time. She appears well-developed and well-nourished. No distress.  HENT:  Head: Normocephalic and atraumatic.  Right Ear: External ear normal.  Left Ear: External ear normal.  Nose: Nose normal.  Mouth/Throat: Oropharynx is clear and moist. No oropharyngeal exudate.  Eyes: Conjunctivae and EOM are normal. Pupils are equal, round, and reactive to light.  Neck: Normal range of motion and full passive range of motion without pain. Neck supple. No spinous process tenderness and no muscular tenderness present. No rigidity. No edema present.  Cardiovascular: Normal rate, regular rhythm, normal heart sounds and intact distal pulses.   Pulmonary/Chest: Effort normal and breath sounds normal. No respiratory distress.  Abdominal: Soft. Bowel sounds are normal. There is no tenderness.  Musculoskeletal: Normal range of motion. She exhibits no edema and no tenderness.  Lymphadenopathy:    She has no cervical adenopathy.  Neurological: She is alert and oriented to person, place, and time. She has normal strength. No cranial nerve deficit or sensory deficit. Gait normal. GCS eye subscore is 4. GCS verbal subscore is 5. GCS motor subscore is 6.  No pronator drift. Bilateral heel-knee-shin intact.  Skin: Skin is warm and dry. No rash noted. She is not diaphoretic.  Psychiatric: She has a normal mood and affect.    ED  Course  Procedures (including critical care time)  Medications  dexamethasone (DECADRON) injection 10 mg (10 mg Intravenous Given 06/03/13 0254)  diphenhydrAMINE (BENADRYL) injection 25 mg (25 mg Intravenous Given 06/03/13 0254)  metoCLOPramide (REGLAN) injection 10 mg (10 mg Intravenous Given 06/03/13 0254)    Labs Review Labs Reviewed - No data to display Imaging Review No results found.  EKG Interpretation  None       MDM   1. Headache     Afebrile, NAD, non-toxic appearing, AAOx4. Pt HA treated and improved while in ED.  Presentation is like pts typical HA and non concerning for [facility], ICH, Meningitis, or temporal arteritis. Pt is afebrile with no focal neuro deficits, nuchal rigidity, or change in vision. Pt is to follow up with PCP to discuss prophylactic medication. Pt verbalizes understanding and is agreeable with plan to dc. Patient is stable at time of discharge       Jeannetta Ellis, PA-C 06/03/13 4098

## 2013-06-19 ENCOUNTER — Ambulatory Visit: Payer: BC Managed Care – PPO | Admitting: Nurse Practitioner

## 2013-08-18 ENCOUNTER — Ambulatory Visit (INDEPENDENT_AMBULATORY_CARE_PROVIDER_SITE_OTHER): Payer: No Typology Code available for payment source | Admitting: Emergency Medicine

## 2013-08-18 VITALS — BP 122/74 | HR 81 | Temp 98.4°F | Resp 17 | Ht 65.0 in | Wt 213.0 lb

## 2013-08-18 DIAGNOSIS — M5430 Sciatica, unspecified side: Secondary | ICD-10-CM

## 2013-08-18 DIAGNOSIS — J111 Influenza due to unidentified influenza virus with other respiratory manifestations: Secondary | ICD-10-CM

## 2013-08-18 MED ORDER — PSEUDOEPHEDRINE-GUAIFENESIN ER 60-600 MG PO TB12: 1.0000 | ORAL_TABLET | Freq: Two times a day (BID) | ORAL | Status: DC

## 2013-08-18 MED ORDER — HYDROCOD POLST-CHLORPHEN POLST 10-8 MG/5ML PO LQCR: 5.0000 mL | Freq: Two times a day (BID) | ORAL | Status: DC | PRN

## 2013-08-18 MED ORDER — OSELTAMIVIR PHOSPHATE 75 MG PO CAPS: 75.0000 mg | ORAL_CAPSULE | Freq: Two times a day (BID) | ORAL | Status: DC

## 2013-08-18 NOTE — Patient Instructions (Signed)

## 2013-08-18 NOTE — Progress Notes (Signed)
[facility] [address], [place] [id number] [phone]  Date:  08/18/2013   Name:  Joanna Mortoniffany M [name]   DOB:  Dec 04, 1985   MRN:  [id number]  PCP:  No PCP Per Patient    Chief Complaint: Cough, Nasal Congestion, Headache and Sinusitis   History of Present Illness:  [name] is a 28 y.o. very pleasant female patient who presents with the following:  Ill suddenly yesterday with chills and myalgia.  Headache, cough that is productive of scant purulent sputum, nasal congestion and drainage with a mucopurulent drainage.  Family member with flu at home.  No fever.  Has malaise and myalgias and fatigue.  No improvement with over the counter medications or other home remedies. Denies other complaint or health concern today.   Patient Active Problem List   Diagnosis Date Noted  . BMI 35.0-35.9,adult 02/17/2013  . Spondylolisthesis at L5-S1 level-grade 1 02/17/2013  . Migraine, unspecified, without mention of intractable migraine without mention of status migrainosus 12/16/2012  . Tension headache 12/16/2012  . Round ligament pain 01/11/2012  . Back pain in pregnancy 01/11/2012  . Heart palpitations 12/11/2011    Past Medical History  Diagnosis Date  . Depression   . Anxiety   . Headache(784.0)   . Abnormal Pap smear   . Gonorrhea   . HPV (human papilloma virus) infection     age 28  . Migraines   . Hx of scarlet fever 1996  . PVC (premature ventricular contraction)     history of pvc's-started in early preg 5/13 saw [place] -told to reduce caffeine intake-pvc's have stopped  . GERD (gastroesophageal reflux disease)     pregnancy related indigestion-tums prn    Past Surgical History  Procedure Laterality Date  . Cesarean section  2008, 2009  . 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 Wisdom tooth extraction      History  Substance Use Topics  . Smoking status: Current Some Day Smoker -- 0.25 packs/day for 8 years    Types:  Cigarettes  . Smokeless tobacco: Never Used  . Alcohol Use: No    Family History  Problem Relation Age of Onset  . Heart disease Mother   . Heart murmur Mother   . Heart disease Father   . Hypertension Father   . Heart attack Father     twice  . Liver cancer Father   . Lung cancer Father     stage 4  . Cancer Father     lung and liver, dx 12/2011  . Anesthesia problems Neg Hx   . Hypotension Neg Hx   . Malignant hyperthermia Neg Hx   . Pseudochol deficiency Neg Hx   . Hearing loss Neg Hx   . Asthma Daughter   . Asthma Son     No Known Allergies  Medication list has been reviewed and updated.  Current Outpatient Prescriptions on File Prior to Visit  Medication Sig Dispense Refill  . buPROPion (WELLBUTRIN XL) 300 MG 24 hr tablet Take 300 mg by mouth daily.      Marland Kitchen. ibuprofen (ADVIL,MOTRIN) 600 MG tablet Take 1 tablet (600 mg total) by mouth every 6 (six) hours as needed for pain.  30 tablet  0  . norethindrone-ethinyl estradiol (JUNEL FE,GILDESS FE,LOESTRIN FE) 1-20 MG-MCG tablet Take 1 tablet by mouth daily.      . rizatriptan (MAXALT-MLT) 10 MG disintegrating tablet Take 1 tablet (10 mg  total) by mouth as needed for migraine. May repeat in 2 hours if needed  10 tablet  6  . traMADol (ULTRAM) 50 MG tablet Take 1 tablet (50 mg total) by mouth every 6 (six) hours as needed for pain.  15 tablet  0  . Tranexamic Acid (LYSTEDA PO) Take 2 tablets by mouth 3 (three) times daily as needed (to help slow menstrual flow).       . calcium carbonate (TUMS - DOSED IN MG ELEMENTAL CALCIUM) 500 MG chewable tablet Chew 1 tablet by mouth daily. For heartburn.      . diphenhydrAMINE (BENADRYL) 25 MG tablet Take 25 mg by mouth every 6 (six) hours as needed. Used for allergies      . methocarbamol (ROBAXIN) 500 MG tablet Take 1 tablet (500 mg total) by mouth 2 (two) times daily.  20 tablet  0  . [DISCONTINUED] SUMAtriptan (IMITREX) 50 MG tablet Take 50 mg by mouth every 2 (two) hours as needed.  Patient states that the physician informed her to stop this medication.       No current facility-administered medications on file prior to visit.    Review of Systems:  As per HPI, otherwise negative.    Physical Examination: Filed Vitals:   08/18/13 1536  BP: 122/74  Pulse: 81  Temp: 98.4 F (36.9 C)  Resp: 17   Filed Vitals:   08/18/13 1536  Height: 5\' 5"  (1.651 m)  Weight: 213 lb (96.616 kg)   Body mass index is 35.44 kg/(m^2). Ideal Body Weight: Weight in (lb) to have BMI = 25: 149.9  GEN: extremely obese, NAD, Non-toxic, A & O x 3 HEENT: Atraumatic, Normocephalic. Neck supple. No masses, No LAD. Ears and Nose: No external deformity. CV: RRR, No M/G/R. No JVD. No thrill. No extra heart sounds. PULM: CTA B, no wheezes, crackles, rhonchi. No retractions. No resp. distress. No accessory muscle use. ABD: S, NT, ND, +BS. No rebound. No HSM. EXTR: No c/c/e NEURO Normal gait.  PSYCH: Normally interactive. Conversant. Not depressed or anxious appearing.  Calm demeanor.    Assessment and Plan: Influenza mucinex tamiflu tussionex   Signed,  Phillips Odor, MD

## 2013-09-08 ENCOUNTER — Ambulatory Visit (INDEPENDENT_AMBULATORY_CARE_PROVIDER_SITE_OTHER): Payer: No Typology Code available for payment source | Admitting: Internal Medicine

## 2013-09-08 VITALS — BP 124/68 | HR 82 | Temp 97.6°F | Resp 18 | Ht 64.5 in | Wt 209.0 lb

## 2013-09-08 DIAGNOSIS — Q762 Congenital spondylolisthesis: Secondary | ICD-10-CM

## 2013-09-08 DIAGNOSIS — M4317 Spondylolisthesis, lumbosacral region: Secondary | ICD-10-CM

## 2013-09-08 DIAGNOSIS — M545 Low back pain, unspecified: Secondary | ICD-10-CM

## 2013-09-08 MED ORDER — CYCLOBENZAPRINE HCL 10 MG PO TABS: 10.0000 mg | ORAL_TABLET | Freq: Every day | ORAL | Status: DC

## 2013-09-08 MED ORDER — MELOXICAM 15 MG PO TABS: 15.0000 mg | ORAL_TABLET | Freq: Every day | ORAL | Status: DC

## 2013-09-08 NOTE — Progress Notes (Signed)
Subjective:    Patient ID: [name], female    DOB: 05-02-1986, 28 y.o.   MRN: [id number] This chart was scribed for Ellamae Siaobert Doolittle, MD by Valera CastleSteven Perry, ED Scribe. This patient was seen in room 12 and the patient's care was started at 10:42 AM.  Chief Complaint  Patient presents with   Back Pain    since last night     HPI [name] is a 28 y.o. female Pt presents with mid, lower back pain since last night after she bent over to pick up a toy. She reports she felt a sharp, shooting pain in her lower back, and states that intermittently her back pain will radiated down her LE. She reports that her pain is worse when walking than when sitting. She reports h/o back pain, but states she cannot afford therapy. She states she has been doing the exercises they gave her. She reports having back imaging done, being told the bottom of her spine is malaligned. She denies having MRI. She denies numbness and tingling. She denies having missed work due to her back pain.   PCP - No PCP Per Patient  Patient Active Problem List   Diagnosis Date Noted   BMI 35.0-35.9,adult 02/17/2013   Spondylolisthesis at L5-S1 level-grade 1 02/17/2013   Migraine, unspecified, without mention of intractable migraine without mention of status migrainosus 12/16/2012   Tension headache 12/16/2012   Round ligament pain 01/11/2012   Back pain in pregnancy 01/11/2012   Heart palpitations 12/11/2011   Prior to Admission medications   Medication Sig Start Date End Date Taking? Authorizing Provider  buPROPion (WELLBUTRIN XL) 300 MG 24 hr tablet Take 300 mg by mouth daily.   Yes Historical Provider, MD  diphenhydrAMINE (BENADRYL) 25 MG tablet Take 25 mg by mouth every 6 (six) hours as needed. Used for allergies   Yes Historical Provider, MD  ibuprofen (ADVIL,MOTRIN) 600 MG tablet Take 1 tablet (600 mg total) by mouth every 6 (six) hours as needed for pain. 04/10/13  Yes Loren Raceravid Yelverton, MD    methocarbamol (ROBAXIN) 500 MG tablet Take 1 tablet (500 mg total) by mouth 2 (two) times daily. 04/10/13  Yes Loren Raceravid Yelverton, MD  norethindrone-ethinyl estradiol (JUNEL FE,GILDESS FE,LOESTRIN FE) 1-20 MG-MCG tablet Take 1 tablet by mouth daily.   Yes Historical Provider, MD  rizatriptan (MAXALT-MLT) 10 MG disintegrating tablet Take 1 tablet (10 mg total) by mouth as needed for migraine. May repeat in 2 hours if needed 12/16/12  Yes Nilda RiggsNancy Carolyn Martin, NP  Tranexamic Acid (LYSTEDA PO) Take 2 tablets by mouth 3 (three) times daily as needed (to help slow menstrual flow).    Yes Historical Provider, MD  calcium carbonate (TUMS - DOSED IN MG ELEMENTAL CALCIUM) 500 MG chewable tablet Chew 1 tablet by mouth daily. For heartburn.    Historical Provider, MD  chlorpheniramine-HYDROcodone (TUSSIONEX PENNKINETIC ER) 10-8 MG/5ML LQCR Take 5 mLs by mouth every 12 (twelve) hours as needed. 08/18/13   Phillips OdorJeffery Anderson, MD  oseltamivir (TAMIFLU) 75 MG capsule Take 1 capsule (75 mg total) by mouth 2 (two) times daily. 08/18/13   Phillips OdorJeffery Anderson, MD  pseudoephedrine-guaifenesin [facility](MUCINEX D) 60-600 MG per tablet Take 1 tablet by mouth every 12 (twelve) hours. 08/18/13 08/18/14  Phillips OdorJeffery Anderson, MD  traMADol (ULTRAM) 50 MG tablet Take 1 tablet (50 mg total) by mouth every 6 (six) hours as needed for pain. 04/10/13   Loren Raceravid Yelverton, MD   Review of Systems  Musculoskeletal: Positive for back  pain (mid, lower that intermittently radiates to LE).  Neurological: Negative for numbness (and tingling).      Objective:   Physical Exam  Nursing note and vitals reviewed. Constitutional: She is oriented to person, place, and time. She appears well-developed and well-nourished. No distress.  HENT:  Head: Normocephalic and atraumatic.  Eyes: EOM are normal.  Neck: Neck supple.  Cardiovascular: Normal rate.   Pulmonary/Chest: Effort normal. No respiratory distress.  Musculoskeletal: Normal range of motion. She exhibits  tenderness. She exhibits no edema.  Tender in midline lumbar over the L-3,4,5 and over right iliac crest. SLR negative to 90 degrees bilaterally.    Neurological: She is alert and oriented to person, place, and time. She has normal strength and normal reflexes. She displays normal reflexes. No cranial nerve deficit or sensory deficit. She exhibits normal muscle tone. Coordination normal.  DTRs 2+ symmetrically. No sensory losses in LE.   Skin: Skin is warm and dry.  Psychiatric: She has a normal mood and affect. Her behavior is normal.    BP 124/68   Pulse 82   Temp(Src) 97.6 F (36.4 C) (Oral)   Resp 18   Ht 5' 4.5" (1.638 m)   Wt 209 lb (94.802 kg)   BMI 35.33 kg/m2   SpO2 99%   LMP 08/29/2013     Assessment & Plan:  LBP (low back pain)  Spondylolisthesis at L5-S1 level-grade 1  Meds ordered this encounter  Medications   meloxicam (MOBIC) 15 MG tablet    Sig: Take 1 tablet (15 mg total) by mouth daily.    Dispense:  30 tablet    Refill:  0   cyclobenzaprine (FLEXERIL) 10 MG tablet    Sig: Take 1 tablet (10 mg total) by mouth at bedtime.    Dispense:  30 tablet    Refill:  0   Encouraged to focus on strengthening core muscles to control spondylolisthesis If not better in 2 weeks will refer to physical therapy     I have completed the patient encounter in its entirety as documented by the scribe, with editing by me where necessary. Robert P. Merla Riches, M.D.

## 2013-10-05 ENCOUNTER — Other Ambulatory Visit: Payer: Self-pay | Admitting: Internal Medicine

## 2013-11-22 ENCOUNTER — Other Ambulatory Visit: Payer: Self-pay | Admitting: Internal Medicine

## 2013-12-28 ENCOUNTER — Other Ambulatory Visit: Payer: Self-pay

## 2013-12-28 MED ORDER — RIZATRIPTAN BENZOATE 10 MG PO TBDP: 10.0000 mg | ORAL_TABLET | ORAL | Status: DC | PRN

## 2013-12-29 ENCOUNTER — Other Ambulatory Visit: Payer: Self-pay | Admitting: Physician Assistant

## 2014-02-13 ENCOUNTER — Emergency Department (HOSPITAL_COMMUNITY): Payer: No Typology Code available for payment source

## 2014-02-13 ENCOUNTER — Emergency Department (HOSPITAL_COMMUNITY)
Admission: EM | Admit: 2014-02-13 | Discharge: 2014-02-13 | Disposition: A | Discharge status: Home / Self Care | Payer: No Typology Code available for payment source | Attending: Emergency Medicine | Admitting: Emergency Medicine

## 2014-02-13 ENCOUNTER — Encounter (HOSPITAL_COMMUNITY): Payer: Self-pay | Admitting: Emergency Medicine

## 2014-02-13 DIAGNOSIS — F17200 Nicotine dependence, unspecified, uncomplicated: Secondary | ICD-10-CM | POA: Insufficient documentation

## 2014-02-13 DIAGNOSIS — Z8719 Personal history of other diseases of the digestive system: Secondary | ICD-10-CM | POA: Insufficient documentation

## 2014-02-13 DIAGNOSIS — F411 Generalized anxiety disorder: Secondary | ICD-10-CM | POA: Insufficient documentation

## 2014-02-13 DIAGNOSIS — N898 Other specified noninflammatory disorders of vagina: Secondary | ICD-10-CM | POA: Insufficient documentation

## 2014-02-13 DIAGNOSIS — Z79899 Other long term (current) drug therapy: Secondary | ICD-10-CM | POA: Insufficient documentation

## 2014-02-13 DIAGNOSIS — F329 Major depressive disorder, single episode, unspecified: Secondary | ICD-10-CM | POA: Insufficient documentation

## 2014-02-13 DIAGNOSIS — G43909 Migraine, unspecified, not intractable, without status migrainosus: Secondary | ICD-10-CM | POA: Insufficient documentation

## 2014-02-13 DIAGNOSIS — Z3202 Encounter for pregnancy test, result negative: Secondary | ICD-10-CM | POA: Insufficient documentation

## 2014-02-13 DIAGNOSIS — R079 Chest pain, unspecified: Secondary | ICD-10-CM | POA: Insufficient documentation

## 2014-02-13 DIAGNOSIS — Z8619 Personal history of other infectious and parasitic diseases: Secondary | ICD-10-CM | POA: Insufficient documentation

## 2014-02-13 DIAGNOSIS — R0602 Shortness of breath: Secondary | ICD-10-CM | POA: Insufficient documentation

## 2014-02-13 DIAGNOSIS — F3289 Other specified depressive episodes: Secondary | ICD-10-CM | POA: Insufficient documentation

## 2014-02-13 DIAGNOSIS — R071 Chest pain on breathing: Secondary | ICD-10-CM

## 2014-02-13 LAB — URINALYSIS, ROUTINE W REFLEX MICROSCOPIC
BILIRUBIN URINE: NEGATIVE
GLUCOSE, UA: NEGATIVE mg/dL
Ketones, ur: NEGATIVE mg/dL
NITRITE: NEGATIVE
PH: 7.5 (ref 5.0–8.0)
Protein, ur: NEGATIVE mg/dL
SPECIFIC GRAVITY, URINE: 1.018 (ref 1.005–1.030)
Urobilinogen, UA: 1 mg/dL (ref 0.0–1.0)

## 2014-02-13 LAB — BASIC METABOLIC PANEL
Anion gap: 13 (ref 5–15)
BUN: 9 mg/dL (ref 6–23)
CHLORIDE: 104 meq/L (ref 96–112)
CO2: 25 meq/L (ref 19–32)
Calcium: 9.3 mg/dL (ref 8.4–10.5)
Creatinine, Ser: 0.62 mg/dL (ref 0.50–1.10)
GFR calc Af Amer: >90 mL/min (ref >90)
GFR calc non Af Amer: >90 mL/min (ref >90)
GLUCOSE: 77 mg/dL (ref 70–99)
Potassium: 3.9 mEq/L (ref 3.7–5.3)
Sodium: 142 mEq/L (ref 137–147)

## 2014-02-13 LAB — CBC
HEMATOCRIT: 38.9 % (ref 36.0–46.0)
HEMOGLOBIN: 13.1 g/dL (ref 12.0–15.0)
MCH: 29.8 pg (ref 26.0–34.0)
MCHC: 33.7 g/dL (ref 30.0–36.0)
MCV: 88.6 fL (ref 78.0–100.0)
Platelets: 298 10*3/uL (ref 150–400)
RBC: 4.39 MIL/uL (ref 3.87–5.11)
RDW: 14 % (ref 11.5–15.5)
WBC: 7.1 10*3/uL (ref 4.0–10.5)

## 2014-02-13 LAB — I-STAT TROPONIN, ED: Troponin i, poc: 0 ng/mL (ref 0.00–0.08)

## 2014-02-13 LAB — PREGNANCY, URINE: PREG TEST UR: NEGATIVE

## 2014-02-13 LAB — URINE MICROSCOPIC-ADD ON

## 2014-02-13 LAB — WET PREP, GENITAL
Trich, Wet Prep: NONE SEEN
Yeast Wet Prep HPF POC: NONE SEEN

## 2014-02-13 LAB — D-DIMER, QUANTITATIVE (NOT AT ARMC): D DIMER QUANT: 0.34 ug{FEU}/mL (ref 0.00–0.48)

## 2014-02-13 LAB — POC URINE PREG, ED: Preg Test, Ur: NEGATIVE

## 2014-02-13 NOTE — ED Provider Notes (Signed)
CSN: [id number]     Arrival date & time 02/13/14  1729 History   First MD Initiated Contact with Patient 02/13/14 2052     Chief Complaint  Patient presents with  . Vaginal Bleeding  . Chest Pain  . Shortness of Breath     (Consider location/radiation/quality/duration/timing/severity/associated sxs/prior Treatment) HPI  28 y/o female with recent PMH of dysfunctional uterian bleeding who has recently been started on estrogen therapy. The patient notes that she started to have intermittent left sides moderate chest tightness that is not exertional in nature, intermittent with no noted exacerbating factors. With associated SOB with no nausea/vomiting. Patient has no LE edema, no history of clot or clotting disorder, no recent surgeries.   Past Medical History  Diagnosis Date  . Depression   . Anxiety   . Headache(784.0)   . Abnormal Pap smear   . Gonorrhea   . 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 Migraines   . Hx of scarlet fever 1996  . PVC (premature ventricular contraction)     history of pvc's-started in early preg 5/13 saw [facility] -told to reduce caffeine intake-pvc's have stopped  . GERD (gastroesophageal reflux disease)     pregnancy related indigestion-tums prn   Past Surgical History  Procedure Laterality Date  . Cesarean section  2008, 2009  . 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 Wisdom tooth extraction     Family History  Problem Relation Age of Onset  . Heart disease Mother   . Heart murmur Mother   . Heart disease Father   . Hypertension Father   . Heart attack Father     twice  . Liver cancer Father   . Lung cancer Father     stage 4  . Cancer Father     lung and liver, dx 12/2011  . Anesthesia problems Neg Hx   . Hypotension Neg Hx   . Malignant hyperthermia Neg Hx   . Pseudochol deficiency Neg Hx   . Hearing loss Neg Hx   . Asthma Daughter   . Asthma Son    History  Substance Use Topics  . Smoking status: Current  Some Day Smoker -- 0.25 packs/day for 8 years    Types: Cigarettes  . Smokeless tobacco: Never Used  . Alcohol Use: No   OB History   Grav Para Term Preterm Abortions TAB SAB Ect Mult Living   3 3 3       3      Review of Systems  Constitutional: Negative for activity change.  HENT: Negative for congestion.   Respiratory: Positive for chest tightness and shortness of breath. Negative for cough.   Cardiovascular: Positive for chest pain. Negative for leg swelling.  Gastrointestinal: Negative for nausea, vomiting, abdominal pain, diarrhea, constipation, blood in stool and abdominal distention.  Genitourinary: Negative for dysuria, flank pain and vaginal discharge.  Musculoskeletal: Negative for back pain.  Skin: Negative for color change.  Neurological: Negative for syncope and headaches.  Psychiatric/Behavioral: Negative for agitation.      Allergies  Review of patient's allergies indicates no known allergies.  Home Medications   Prior to Admission medications   Medication Sig Start Date End Date Taking? Authorizing Provider  buPROPion (WELLBUTRIN XL) 300 MG 24 hr tablet Take 300 mg by mouth daily.    Historical Provider, MD  calcium carbonate (TUMS - DOSED IN MG ELEMENTAL CALCIUM) 500 MG chewable tablet Chew  1 tablet by mouth daily. For heartburn.    Historical Provider, MD  chlorpheniramine-HYDROcodone (TUSSIONEX PENNKINETIC ER) 10-8 MG/5ML LQCR Take 5 mLs by mouth every 12 (twelve) hours as needed. 08/18/13   Phillips OdorJeffery Anderson, MD  cyclobenzaprine (FLEXERIL) 10 MG tablet Take 1 tablet (10 mg total) by mouth at bedtime. 09/08/13   Tonye Pearsonobert P Doolittle, MD  diphenhydrAMINE (BENADRYL) 25 MG tablet Take 25 mg by mouth every 6 (six) hours as needed. Used for allergies    Historical Provider, MD  ibuprofen (ADVIL,MOTRIN) 600 MG tablet Take 1 tablet (600 mg total) by mouth every 6 (six) hours as needed for pain. 04/10/13   Loren Raceravid Yelverton, MD  meloxicam (MOBIC) 15 MG tablet Take 1 tablet (15  mg total) by mouth daily. Needs to call to discuss for further refills. 11/22/13   Sondra Bargesyan M Dunn, PA-C  methocarbamol (ROBAXIN) 500 MG tablet Take 1 tablet (500 mg total) by mouth 2 (two) times daily. 04/10/13   Loren Raceravid Yelverton, MD  norethindrone-ethinyl estradiol (JUNEL FE,GILDESS FE,LOESTRIN FE) 1-20 MG-MCG tablet Take 1 tablet by mouth daily.    Historical Provider, MD  oseltamivir (TAMIFLU) 75 MG capsule Take 1 capsule (75 mg total) by mouth 2 (two) times daily. 08/18/13   Phillips OdorJeffery Anderson, MD  pseudoephedrine-guaifenesin [facility](MUCINEX D) 60-600 MG per tablet Take 1 tablet by mouth every 12 (twelve) hours. 08/18/13 08/18/14  Phillips OdorJeffery Anderson, MD  rizatriptan (MAXALT-MLT) 10 MG disintegrating tablet Take 1 tablet (10 mg total) by mouth as needed for migraine. May repeat in 2 hours if needed 12/28/13   Melvyn Novasarmen Dohmeier, MD  traMADol (ULTRAM) 50 MG tablet Take 1 tablet (50 mg total) by mouth every 6 (six) hours as needed for pain. 04/10/13   Loren Raceravid Yelverton, MD  Tranexamic Acid (LYSTEDA PO) Take 2 tablets by mouth 3 (three) times daily as needed (to help slow menstrual flow).     Historical Provider, MD   BP 124/68  Pulse 83  Temp(Src) 98.3 F (36.8 C) (Oral)  Resp 16  Wt 215 lb (97.523 kg)  SpO2 100%  LMP 02/13/2014 Physical Exam  Constitutional: She is oriented to person, place, and time. She appears well-developed.  HENT:  Head: Normocephalic.  Eyes: Pupils are equal, round, and reactive to light.  Neck: Neck supple.  Cardiovascular: Normal rate.  Exam reveals no gallop and no friction rub.   No murmur heard. No pain with palpation   Pulmonary/Chest: Effort normal and breath sounds normal. No respiratory distress.  Abdominal: Soft. She exhibits no distension. There is no tenderness. There is no rebound.  Genitourinary:  Blood in vault no CMT, no other discharge  Musculoskeletal: She exhibits no edema.  Neurological: She is alert and oriented to person, place, and time.  Skin: Skin is warm.   Psychiatric: She has a normal mood and affect.    ED Course  Procedures (including critical care time) Labs Review Labs Reviewed  CBC  BASIC METABOLIC PANEL  POC URINE PREG, ED  Rosezena SensorI-STAT TROPOININ, ED    Imaging Review Dg Chest 2 View  02/13/2014   CLINICAL DATA:  Shortness of breath, chest pain.  EXAM: CHEST  2 VIEW  COMPARISON:  None.  FINDINGS: The heart size and mediastinal contours are within normal limits. Both lungs are clear. The visualized skeletal structures are unremarkable.  IMPRESSION: No radiographic evidence of active cardiopulmonary disease.   Electronically Signed   By: Jearld LeschAndrew  DelGaizo M.D.   On: 02/13/2014 19:01     EKG Interpretation None  MDM   Final diagnoses:  Chest pain on breathing   28 y/o female with chest pain and SOB started on estrogen for vaginal bleeding. Patient is Well's score of 0 with no tachycardia, hemoptysis or other risk factors. D-Dimer negative, highly unlikely this represents PE. Trop- neg, CXR- no acute pathology, CBC and BMP non-contributory. UA- equivocal with dirty catch.  Chest pain most likely MSK in nature, patient instructed to do scheduled ibuprofen for 1 week and follow-up with PCP.     Clement Sayres, MD 02/15/14 [phone]

## 2014-02-13 NOTE — ED Notes (Signed)
PResents with vaginal bleeding since MAy 20th-going through a pad an hour-had Merena placed associated with cramping in lower left abdomen and headache. For one week reports intermittient SOB and chest pain on left side. Nothing makes pain better, movement makes pain worse. Also c/o dizziness. Color WNL. VSS.

## 2014-02-14 LAB — GC/CHLAMYDIA PROBE AMP
CT Probe RNA: NEGATIVE
GC PROBE AMP APTIMA: NEGATIVE

## 2014-02-21 NOTE — ED Provider Notes (Signed)
I saw and evaluated the patient, reviewed the resident's note and I agree with the findings and plan.   EKG Interpretation   Date/Time:  Tuesday February 13 2014 17:35:57 EDT Ventricular Rate:  78 PR Interval:  142 QRS Duration: 88 QT Interval:  390 QTC Calculation: 444 R Axis:   18 Text Interpretation:  Normal sinus rhythm Normal ECG Confirmed by  Obryan Radu, MD, Taygen Newsome ([id number]) on 02/13/2014 10:35:34 PM      Pt comes in with cc of chest pain and dib. WELLS score is 0, but given estrogen use - dimer ordered and is neg. Pt on my exam has no acute resp distress. Advised pcp f/u, and return precautions discussed.   Derwood KaplanAnkit Natilee Gauer, MD 02/21/14 [phone]

## 2014-02-25 ENCOUNTER — Other Ambulatory Visit: Payer: Self-pay | Admitting: Neurology

## 2014-03-07 ENCOUNTER — Encounter (HOSPITAL_COMMUNITY): Payer: Self-pay | Admitting: Emergency Medicine

## 2014-03-07 ENCOUNTER — Emergency Department (HOSPITAL_COMMUNITY)
Admission: EM | Admit: 2014-03-07 | Discharge: 2014-03-07 | Disposition: A | Discharge status: Home / Self Care | Payer: No Typology Code available for payment source | Attending: Emergency Medicine | Admitting: Emergency Medicine

## 2014-03-07 DIAGNOSIS — G43909 Migraine, unspecified, not intractable, without status migrainosus: Secondary | ICD-10-CM | POA: Insufficient documentation

## 2014-03-07 DIAGNOSIS — Z21 Asymptomatic human immunodeficiency virus [HIV] infection status: Secondary | ICD-10-CM | POA: Insufficient documentation

## 2014-03-07 DIAGNOSIS — Z8619 Personal history of other infectious and parasitic diseases: Secondary | ICD-10-CM | POA: Insufficient documentation

## 2014-03-07 DIAGNOSIS — IMO0002 Reserved for concepts with insufficient information to code with codable children: Secondary | ICD-10-CM | POA: Insufficient documentation

## 2014-03-07 DIAGNOSIS — F329 Major depressive disorder, single episode, unspecified: Secondary | ICD-10-CM | POA: Insufficient documentation

## 2014-03-07 DIAGNOSIS — F411 Generalized anxiety disorder: Secondary | ICD-10-CM | POA: Insufficient documentation

## 2014-03-07 DIAGNOSIS — L02412 Cutaneous abscess of left axilla: Secondary | ICD-10-CM

## 2014-03-07 DIAGNOSIS — F3289 Other specified depressive episodes: Secondary | ICD-10-CM | POA: Insufficient documentation

## 2014-03-07 DIAGNOSIS — F17200 Nicotine dependence, unspecified, uncomplicated: Secondary | ICD-10-CM | POA: Insufficient documentation

## 2014-03-07 DIAGNOSIS — Z8719 Personal history of other diseases of the digestive system: Secondary | ICD-10-CM | POA: Insufficient documentation

## 2014-03-07 DIAGNOSIS — Z79899 Other long term (current) drug therapy: Secondary | ICD-10-CM | POA: Insufficient documentation

## 2014-03-07 DIAGNOSIS — Z8679 Personal history of other diseases of the circulatory system: Secondary | ICD-10-CM | POA: Insufficient documentation

## 2014-03-07 MED ORDER — HYDROCODONE-ACETAMINOPHEN 5-325 MG PO TABS: 1.0000 | ORAL_TABLET | Freq: Four times a day (QID) | ORAL | Status: DC | PRN

## 2014-03-07 NOTE — ED Notes (Signed)
Pt reported that abscess stated as a painfree "knot" one month ago. Abscess increased in size and tenderness over the last month

## 2014-03-07 NOTE — Discharge Instructions (Signed)
Apply warm compress to affected area several times daily.  Remove packing in 2 days or follow up at your doctor's office for further care.  [facility] for elective treatment of hidradenitis suppurativa.    Abscess An abscess is an infected area that contains a collection of pus and debris.It can occur in almost any part of the body. An abscess is also known as a furuncle or boil. CAUSES  An abscess occurs when tissue gets infected. This can occur from blockage of oil or sweat glands, infection of hair follicles, or a minor injury to the skin. As the body tries to fight the infection, pus collects in the area and creates pressure under the skin. This pressure causes pain. People with weakened immune systems have difficulty fighting infections and get certain abscesses more often.  SYMPTOMS Usually an abscess develops on the skin and becomes a painful mass that is red, warm, and tender. If the abscess forms under the skin, you may feel a moveable soft area under the skin. Some abscesses break open (rupture) on their own, but most will continue to get worse without care. The infection can spread deeper into the body and eventually into the bloodstream, causing you to feel ill.  DIAGNOSIS  Your caregiver will take your medical history and perform a physical exam. A sample of fluid may also be taken from the abscess to determine what is causing your infection. TREATMENT  Your caregiver may prescribe antibiotic medicines to fight the infection. However, taking antibiotics alone usually does not cure an abscess. Your caregiver may need to make a small cut (incision) in the abscess to drain the pus. In some cases, gauze is packed into the abscess to reduce pain and to continue draining the area. HOME CARE INSTRUCTIONS   Only take over-the-counter or prescription medicines for pain, discomfort, or fever as directed by your caregiver.  If you were prescribed antibiotics, take them  as directed. Finish them even if you start to feel better.  If gauze is used, follow your caregiver's directions for changing the gauze.  To avoid spreading the infection:  Keep your draining abscess covered with a bandage.  Wash your hands well.  Do not share personal care items, towels, or whirlpools with others.  Avoid skin contact with others.  Keep your skin and clothes clean around the abscess.  Keep all follow-up appointments as directed by your caregiver. SEEK MEDICAL CARE IF:   You have increased pain, swelling, redness, fluid drainage, or bleeding.  You have muscle aches, chills, or a general ill feeling.  You have a fever. MAKE SURE YOU:   Understand these instructions.  Will watch your condition.  Will get help right away if you are not doing well or get worse. Document Released: 04/29/2005 Document Revised: 01/19/2012 Document Reviewed: 10/02/2011 [facility] Patient Information 2015 [employer] [place]. This information is not intended to replace advice given to you by your health care provider. Make sure you discuss any questions you have with your health care provider.

## 2014-03-07 NOTE — ED Provider Notes (Signed)
Medical screening examination/treatment/procedure(s) were performed by non-physician practitioner and as supervising physician I was immediately available for consultation/collaboration.   EKG Interpretation None        Audree CamelScott T Avrianna Smart, MD 03/07/14 2229

## 2014-03-07 NOTE — ED Provider Notes (Signed)
CSN: [id number]     Arrival date & time 03/07/14  1625 History  This chart was scribed for Joanna HelperBowie Jabre Heo, PA-C, working with Audree CamelScott T Goldston, MD by Chestine SporeSoijett Blue, ED Scribe. The patient was seen in room WTR7/WTR7 at 4:37 PM.     Chief Complaint  Patient presents with  . Abscess    l/axilla     The history is provided by the patient. No language interpreter was used.  HPI Comments: [name] is a 28 y.o. female who presents to the Emergency Department complaining of  An abscess onset 1 month. She states that it has gotten bigger last night. She states that it is now making her head hurt. She states that she was put on 20 days of doxycycline. She states that she finished the doxy 2.5 weeks ago. She states that she has used a clindamycin gel for it as well. She states that she had her last tetanus shot 2 years ago. She states that she has had this before and she has had it to be drained before. She denies any other associated symptoms. Pt has hx of hidradenitis suppurativa    Past Medical History  Diagnosis Date  . Depression   . Anxiety   . Headache(784.0)   . Abnormal Pap smear   . Gonorrhea   . HPV (human papilloma virus) infection     age 28  . Migraines   . Hx of scarlet fever 1996  . PVC (premature ventricular contraction)     history of pvc's-started in early preg 5/13 saw [facility] -told to reduce caffeine intake-pvc's have stopped  . GERD (gastroesophageal reflux disease)     pregnancy related indigestion-tums prn   Past Surgical History  Procedure Laterality Date  . Cesarean section  2008, 2009  . 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 Wisdom tooth extraction     Family History  Problem Relation Age of Onset  . Heart disease Mother   . Heart murmur Mother   . Heart disease Father   . Hypertension Father   . Heart attack Father     twice  . Liver cancer Father   . Lung cancer Father     stage 4  . Cancer Father     lung and liver, dx 12/2011  .  Anesthesia problems Neg Hx   . Hypotension Neg Hx   . Malignant hyperthermia Neg Hx   . Pseudochol deficiency Neg Hx   . Hearing loss Neg Hx   . Asthma Daughter   . Asthma Son    History  Substance Use Topics  . Smoking status: Current Some Day Smoker -- 0.25 packs/day for 8 years    Types: Cigarettes  . Smokeless tobacco: Never Used  . Alcohol Use: No   OB History   Grav Para Term Preterm Abortions TAB SAB Ect Mult Living   3 3 3       3      Review of Systems  Constitutional: Negative for fever.  Skin: Positive for rash.       Abscess on the left armpit      Allergies  Review of patient's allergies indicates no known allergies.  Home Medications   Prior to Admission medications   Medication Sig Start Date End Date Taking? Authorizing Provider  buPROPion (WELLBUTRIN XL) 300 MG 24 hr tablet Take 300 mg by mouth daily.   Yes Historical Provider, MD  CLINDAGEL 1 % gel  Apply 1 application topically as needed (for "boils").  01/25/14  Yes Historical Provider, MD  ibuprofen (ADVIL,MOTRIN) 200 MG tablet Take 800-1,200 mg by mouth every 8 (eight) hours as needed (pain.).   Yes Historical Provider, MD  rizatriptan (MAXALT-MLT) 10 MG disintegrating tablet Take 1 tablet (10 mg total) by mouth as needed for migraine. May repeat in 2 hours if needed 12/28/13  Yes Porfirio Mylar Dohmeier, MD   BP 135/70  Pulse 88  Temp(Src) 98.8 F (37.1 C) (Oral)  Resp 18  SpO2 97%  LMP 02/13/2014   Physical Exam  Nursing note and vitals reviewed. Constitutional: She is oriented to person, place, and time. She appears well-developed and well-nourished. No distress.  HENT:  Head: Normocephalic and atraumatic.  Eyes: EOM are normal.  Neck: Neck supple. No tracheal deviation present.  Cardiovascular: Normal rate.   Pulmonary/Chest: Effort normal. No respiratory distress.  Musculoskeletal: Normal range of motion.  Neurological: She is alert and oriented to person, place, and time.  Skin: Skin is warm  and dry. There is erythema.  Left armpit area of induration and fluctuate to the axillary fold. Tenderness to palpitation with surrounding erythema extending to left upper arm.  Psychiatric: She has a normal mood and affect. Her behavior is normal.    ED Course  Procedures (including critical care time) DIAGNOSTIC STUDIES: Oxygen Saturation is 97% on room air, normal by my interpretation.    COORDINATION OF CARE: 4:42 PM-Discussed the best treatment which is to be seen by a surgeon at [facility] to help with the recurring lymphnode issue. Discussed the treatment plan which includes Vicodin with pt at bedside and pt agreed to plan.   INCISION AND DRAINAGE Performed by: Joanna Helper Consent: Verbal consent obtained. Risks and benefits: risks, benefits and alternatives were discussed Type: abscess  Body area: L axillary region  Anesthesia: local infiltration  Incision was made with a scalpel.  Local anesthetic: lidocaine 2% w epinephrine  Anesthetic total: 4 ml  Complexity: complex Blunt dissection to break up loculations  Drainage: purulent  Drainage amount: moderate  Packing material: 1/4 in iodoform gauze  Patient tolerance: Patient tolerated the procedure well with no immediate complications.     Labs Review Labs Reviewed - No data to display  Imaging Review No results found.   EKG Interpretation None      MDM   Final diagnoses:  Cutaneous abscess of left axilla    BP 135/70  Pulse 88  Temp(Src) 98.8 F (37.1 C) (Oral)  Resp 18  SpO2 97%  LMP 02/13/2014   I personally performed the services described in this documentation, which was scribed in my presence. The recorded information has been reviewed and is accurate.    Joanna Helper, PA-C 03/07/14 1708

## 2014-03-21 ENCOUNTER — Encounter (HOSPITAL_COMMUNITY): Payer: Self-pay | Admitting: Emergency Medicine

## 2014-03-21 ENCOUNTER — Emergency Department (HOSPITAL_COMMUNITY)
Admission: EM | Admit: 2014-03-21 | Discharge: 2014-03-21 | Disposition: A | Discharge status: Home / Self Care | Payer: No Typology Code available for payment source | Attending: Emergency Medicine | Admitting: Emergency Medicine

## 2014-03-21 DIAGNOSIS — R638 Other symptoms and signs concerning food and fluid intake: Secondary | ICD-10-CM | POA: Diagnosis not present

## 2014-03-21 DIAGNOSIS — Z8679 Personal history of other diseases of the circulatory system: Secondary | ICD-10-CM | POA: Insufficient documentation

## 2014-03-21 DIAGNOSIS — R110 Nausea: Secondary | ICD-10-CM | POA: Diagnosis not present

## 2014-03-21 DIAGNOSIS — Z792 Long term (current) use of antibiotics: Secondary | ICD-10-CM | POA: Diagnosis not present

## 2014-03-21 DIAGNOSIS — Z8619 Personal history of other infectious and parasitic diseases: Secondary | ICD-10-CM | POA: Insufficient documentation

## 2014-03-21 DIAGNOSIS — F329 Major depressive disorder, single episode, unspecified: Secondary | ICD-10-CM | POA: Diagnosis not present

## 2014-03-21 DIAGNOSIS — F17200 Nicotine dependence, unspecified, uncomplicated: Secondary | ICD-10-CM | POA: Insufficient documentation

## 2014-03-21 DIAGNOSIS — F3289 Other specified depressive episodes: Secondary | ICD-10-CM | POA: Diagnosis not present

## 2014-03-21 DIAGNOSIS — G43909 Migraine, unspecified, not intractable, without status migrainosus: Secondary | ICD-10-CM | POA: Diagnosis not present

## 2014-03-21 DIAGNOSIS — F411 Generalized anxiety disorder: Secondary | ICD-10-CM | POA: Diagnosis not present

## 2014-03-21 DIAGNOSIS — IMO0002 Reserved for concepts with insufficient information to code with codable children: Secondary | ICD-10-CM | POA: Insufficient documentation

## 2014-03-21 DIAGNOSIS — Z79899 Other long term (current) drug therapy: Secondary | ICD-10-CM | POA: Diagnosis not present

## 2014-03-21 DIAGNOSIS — L02412 Cutaneous abscess of left axilla: Secondary | ICD-10-CM

## 2014-03-21 DIAGNOSIS — L0390 Cellulitis, unspecified: Secondary | ICD-10-CM

## 2014-03-21 DIAGNOSIS — L0291 Cutaneous abscess, unspecified: Secondary | ICD-10-CM

## 2014-03-21 MED ORDER — LIDOCAINE-EPINEPHRINE-TETRACAINE (LET) SOLUTION
3.0000 mL | Freq: Once | NASAL | Status: AC
Start: 2014-03-21 — End: 2014-03-21
  Administered 2014-03-21: 3 mL via TOPICAL
  Filled 2014-03-21: qty 3

## 2014-03-21 MED ORDER — HYDROCODONE-ACETAMINOPHEN 5-325 MG PO TABS: 1.0000 | ORAL_TABLET | Freq: Four times a day (QID) | ORAL | Status: DC | PRN

## 2014-03-21 MED ORDER — CLINDAMYCIN PHOSPHATE 600 MG/50ML IV SOLN
600.0000 mg | Freq: Once | INTRAVENOUS | Status: AC
  Administered 2014-03-21: 600 mg via INTRAVENOUS
  Filled 2014-03-21: qty 50

## 2014-03-21 MED ORDER — CLINDAMYCIN HCL 150 MG PO CAPS: 450.0000 mg | ORAL_CAPSULE | Freq: Three times a day (TID) | ORAL | Status: DC

## 2014-03-21 MED ORDER — HYDROCODONE-ACETAMINOPHEN 5-325 MG PO TABS
1.0000 | ORAL_TABLET | Freq: Four times a day (QID) | ORAL | Status: DC | PRN
Start: 2014-03-21 — End: 2016-09-02

## 2014-03-21 MED ORDER — SODIUM CHLORIDE 0.9 % IV BOLUS (SEPSIS)
1000.0000 mL | Freq: Once | INTRAVENOUS | Status: AC
  Administered 2014-03-21: 1000 mL via INTRAVENOUS

## 2014-03-21 MED ORDER — ONDANSETRON HCL 4 MG/2ML IJ SOLN
4.0000 mg | Freq: Once | INTRAMUSCULAR | Status: AC
  Administered 2014-03-21: 4 mg via INTRAVENOUS
  Filled 2014-03-21: qty 2

## 2014-03-21 MED ORDER — LIDOCAINE-EPINEPHRINE 2 %-1:100000 IJ SOLN
20.0000 mL | Freq: Once | INTRAMUSCULAR | Status: AC
  Administered 2014-03-21: 20 mL
  Filled 2014-03-21: qty 1

## 2014-03-21 NOTE — ED Notes (Signed)
Pt c/o abscess to L axillary area x 1 month, I & D done at this facility 2 weeks ago, pt states pain and swelling worse since I & D. Pt was seen by [facility] yesterday but pain continues

## 2014-03-21 NOTE — Discharge Instructions (Signed)
Follow up with PCP in 2 days for wound check.  Take Clindamycin 3 pills 3 times a day.  Take Hydrocodone 1-2 pills 4-6 times a day as needed for pain.  Return to the ER if your symptoms should worsen.    Abscess An abscess is an infected area that contains a collection of pus and debris.It can occur in almost any part of the body. An abscess is also known as a furuncle or boil. CAUSES  An abscess occurs when tissue gets infected. This can occur from blockage of oil or sweat glands, infection of hair follicles, or a minor injury to the skin. As the body tries to fight the infection, pus collects in the area and creates pressure under the skin. This pressure causes pain. People with weakened immune systems have difficulty fighting infections and get certain abscesses more often.  SYMPTOMS Usually an abscess develops on the skin and becomes a painful mass that is red, warm, and tender. If the abscess forms under the skin, you may feel a moveable soft area under the skin. Some abscesses break open (rupture) on their own, but most will continue to get worse without care. The infection can spread deeper into the body and eventually into the bloodstream, causing you to feel ill.  DIAGNOSIS  Your caregiver will take your medical history and perform a physical exam. A sample of fluid may also be taken from the abscess to determine what is causing your infection. TREATMENT  Your caregiver may prescribe antibiotic medicines to fight the infection. However, taking antibiotics alone usually does not cure an abscess. Your caregiver may need to make a small cut (incision) in the abscess to drain the pus. In some cases, gauze is packed into the abscess to reduce pain and to continue draining the area. HOME CARE INSTRUCTIONS   Only take over-the-counter or prescription medicines for pain, discomfort, or fever as directed by your caregiver.  If you were prescribed antibiotics, take them as directed. Finish them even  if you start to feel better.  If gauze is used, follow your caregiver's directions for changing the gauze.  To avoid spreading the infection:  Keep your draining abscess covered with a bandage.  Wash your hands well.  Do not share personal care items, towels, or whirlpools with others.  Avoid skin contact with others.  Keep your skin and clothes clean around the abscess.  Keep all follow-up appointments as directed by your caregiver. SEEK MEDICAL CARE IF:   You have increased pain, swelling, redness, fluid drainage, or bleeding.  You have muscle aches, chills, or a general ill feeling.  You have a fever. MAKE SURE YOU:   Understand these instructions.  Will watch your condition.  Will get help right away if you are not doing well or get worse. Document Released: 04/29/2005 Document Revised: 01/19/2012 Document Reviewed: 10/02/2011 [facility] Patient Information 2015 [place], [place]. This information is not intended to replace advice given to you by your health care provider. Make sure you discuss any questions you have with your health care provider.   Emergency Department Resource Guide 1) Find a Doctor and Pay Out of Pocket Although you won't have to find out who is covered by your insurance plan, it is a good idea to ask around and get recommendations. You will then need to call the office and see if the doctor you have chosen will accept you as a new patient and what types of options they offer for patients who are self-pay.  Some doctors offer discounts or will set up payment plans for their patients who do not have insurance, but you will need to ask so you aren't surprised when you get to your appointment.  2) Contact Your Local Health Department Not all health departments have doctors that can see patients for sick visits, but many do, so it is worth a call to see if yours does. If you don't know where your local health department is, you can check in your phone book. The  CDC also has a tool to help you locate your state's health department, and many state websites also have listings of all of their local health departments.  3) Find a Walk-in Clinic If your illness is not likely to be very severe or complicated, you may want to try a walk in clinic. These are popping up all over the country in pharmacies, drugstores, and shopping centers. They're usually staffed by nurse practitioners or physician assistants that have been trained to treat common illnesses and complaints. They're usually fairly quick and inexpensive. However, if you have serious medical issues or chronic medical problems, these are probably not your best option.  No Primary Care Doctor: - Call Health Connect at  [phone] - they can help you locate a primary care doctor that  accepts your insurance, provides certain services, etc. - Physician Referral Service- [phone]  Chronic Pain Problems: Organization         Address  Phone   Notes  [facility]  [phone] Patients need to be referred by their primary care doctor.   Medication Assistance: Organization         Address  Phone   Notes  [facility] Medication [facility] [address] [place] [place], [place] [address] [phone] --Must be a resident of [facility] -- Must have NO insurance coverage whatsoever (no Medicaid/ Medicare, etc.) -- The pt. MUST have a primary care doctor that directs their care regularly and follows them in the community   [facility]  [phone]   [employer]  [phone]    Agencies that provide inexpensive medical care: Organization         Address  Phone   Notes  [facility]  [phone]   [facility]    [phone]   [facility] [address] [place], [place] [address] [phone]   [facility] [address] [place] [place] [phone]   Planned Parenthood    [phone]   [facility]    [phone]   [facility]  [address], [place] Phone:  [phone], Fax:  [phone] Hours of Operation:  9 am - 6 pm, M-F.  Also accepts Medicaid/Medicare and self-pay.  [facility]  [address], [place] Phone: [phone], Fax: [phone]. Hours of Operation:  8:30 am - 5:30 pm, M-F.  Also accepts Medicaid and self-pay.  [facility] [place] [address], [place] Phone: [phone]   [facility] [address] Natasha Bence [place], [place] [phone], Ext. [phone] Mondays & Thursdays: 7-9 AM.  First 15 patients are seen on a first come, first serve basis.    Medicaid-accepting [facility] Providers:  Organization         Address  Phone   Notes  [facility] [address], Ervin Knack, [place] [phone] Also accepts self-pay patients.  [facility] [address] Laurell Josephs [place], [place]  [phone]   [facility] [address], [place] [phone]   [facility] [address], [place] [phone]   Renaye Rakers [address], [place]   [phone] Only accepts [place] patients after they have their name applied to their card.   Self-Pay (no insurance) in [facility]:  Organization         Address  Phone   Notes  Sickle Cell Patients, [facility] [address] [place], [place] [phone]   [facility]gent Care [address] [place], [place] [phone]   [facility] [place]  [address], [place] [phone]   [facility]/Dr. Osei-Bonsu  [address] [place] or [address], [place] [phone] Phone number for both [place] and [place] locations is the same.  [facility] [address] [place] [phone]   [facility] [address] [place] or [address] [phone]   [facility] [address], [place] [phone], phone; [phone], fax Sees patients 1st and 3rd Saturday of every month.  Must not qualify for public or private insurance (i.e. Medicaid, Medicare, [place] Health Choice, Veterans' Benefits)  Household income should be no more than 200% of the poverty level The clinic cannot treat you if you are pregnant or think you are pregnant  Sexually transmitted diseases are not treated at the clinic.    Dental Care: Organization         Address  Phone  Notes  [facility] Department of [facility] [address] [place], [place] [phone] Accepts children up to age 61 who are enrolled in [place] or [place] Health Choice; pregnant women with a Medicaid card; and children who have applied for Medicaid or [place] Health Choice, but were declined, whose parents can pay a reduced fee at time of service.  [facility] Department of [facility]  [address], [place] [phone] Accepts children up to age 44 who are enrolled in [place] or [place] Health Choice; pregnant women with a Medicaid card; and children who have applied for Medicaid or [place] Health Choice, but were declined, whose parents can pay a reduced fee at time of service.  [facility]  [address] [place], [place] [phone] Patients are seen by appointment only. Walk-ins are not accepted. [facility] will see patients 5 years of age and older. Monday - Tuesday (8am-5pm) Most Wednesdays (8:30-5pm) $30 per visit, cash only  [facility]  [address], [facility] [phone] Patients are seen by appointment only. Walk-ins are not accepted. [facility] will see patients 23 years of age and older. One  Wednesday Evening (Monthly: Volunteer Based).  $30 per visit, cash only  [employer]  [phone] for adults; Children under age 58, call Graduate Pediatric Dentistry at [phone]. Children aged 69-14, please call [phone] to request a pediatric application.  Dental services are provided in all areas of dental care including fillings, crowns and bridges, complete and partial dentures, implants, gum treatment, root canals, and extractions.  Preventive care is also provided. Treatment is provided to both adults and children. Patients are selected via a lottery and there is often a waiting list.   [facility] [address], [place]  [phone] [email or web address]   [facility] [address], [place], [place] [phone], Ext. [phone] Second and Fourth Thursday of each month, opens at 6:30 AM; Clinic ends at 9 AM.  Patients are seen on a first-come first-served basis, and a limited number are seen during each clinic.   [facility]  [address], [place], [place] [phone]   Eligibility Requirements You must have lived in [place], [place], or [place] for at least the last three months.   You cannot be eligible for state or federal sponsored [place]healthcare insurance, including CIG[employer]eterans Administration, [place]icaid, or [employer]icare.   You generally cannot be eligible for healthcare insurance through your employer.    How to apply: Eligibility screenings are held every Tuesday and Wednesday afternoon from 1:00 pm until 4:00 pm. You do not need an appointment for the interview!  [facility] [address], [place], [place] [phone]   [facility]  [phone]   [facility]  [phone]   [facility]  [phone]    Behavioral Health Resources in the Community: Intensive Outpatient Programs Organization          Address  Phone  Notes  [facility] [address], [place], [place] [phone]   [facility] [address], [place], [place] [phone]   [facility] [address], [place], [place]  [phone]   [facility] [address],  [place], [place] [phone] or [phone]   Substance Abuse Resources Organization         Address  Phone  Notes  [facility]  [phone]   [facility]  [phone]   [facility]  [phone]   [facility]  [phone]   Residential & Outpatient Substance Abuse Program  [phone]   Psychological Services Organization         Address  Phone  Notes  [facility]  [phone]- [phone]   [facility]  [phone]- [phone]   [facility] [address], [place] [phone] or [phone]    Mobile Crisis Teams Organization         Address  Phone  Notes  [facility], Mobile Crisis Care Unit  [phone]   [facility]  [address] [place], [place] [phone]   Doristine LocksSharon DeEsch [address] [place] [phone]    Self-Help/Support Groups Organization         Address  Phone             Notes  [facility] [place] - variety of support groups  [phone] Call for more information  Narcotics Anonymous (NA), [facility] [address], [place]  2 meetings at this location   Statisticianesidential Treatment Programs Organization         Address  Phone  Notes  [facility] [address],    [place]  [phone]   [facility]  [address], [place] [phone], [place], [place] [phone]   [facility] [address], [place] [phone] Admissions: 8am-3pm M-F  [facility] [address]  [place], [place]   [facility] [address] Jadene Pierini [place], [place]   The [place]  [place], [place]   [facility] - Intensive Outpatient [address] Kristeen [address], [place], [place]   [facility]  [place], [place] [phone] or [phone]   [facility] ([facility]) [address] [place], [place] Accepts Medicaid  [facility] [address],  [place] [phone] Substance Abuse/Addiction Treatment   [facility] Organization         Address  Phone  Notes  [facility]  [phone]   Domenic Schwab, PhD [address] Arlis Porta [place], [place]   [phone] or [phone]   Boyle [place], [place] [phone]   [facility] [address] [place], [place] [phone] Insurance/Medicaid/sponsorship through [facility] and Families [address]                                    [place], [place] [phone] [place] [address], [place] [phone]    Dr. Adele Schilder  [phone]   [facility] Dept. 1) [address] [place] 2) [place] 3)  [place] [address], [place] [phone]  [phone]   [place] [phone] or [phone] (After Hours)

## 2014-03-21 NOTE — ED Provider Notes (Signed)
CSN: [id number]     Arrival date & time 03/21/14  1752 History  This chart was scribed for non-physician practitioner, Ladona Mow, PA-C working with Merrie Roof, MD by Greggory Stallion, ED scribe. This patient was seen in room WTR9/WTR9 and the patient's care was started at 6:45 PM.   Chief Complaint  Patient presents with  . Abscess   The history is provided by the patient. No language interpreter was used.   HPI Comments: [name] is a 28 y.o. female with past medical history of Hidradenitis Suppurativa who presents to the Emergency Department complaining of an abscess to her left axilla that started over one month ago. Pt was evaluated on 03/07/14 for the same. The abscess was drainage and she was discharged home with Vicodin. She was not given an antibiotic because she saw her OB prior to I&D and was given doxycycline for it. States the redness, swelling and pain have increased since having it I&D. Redness is now extending into her upper arm.  States there has been decreased appetite over the last 3 days due to pain. She has taken ibuprofen and used clindamycin gel with no relief. Pt has had abscesses in the past. Denies fever, chills, neck pain. Pt's last menstrual period ended 3-4 days ago.   Past Medical History  Diagnosis Date  . Depression   . Anxiety   . Headache(784.0)   . Abnormal Pap smear   . Gonorrhea   . HPV (human papilloma virus) infection     age 88  . Migraines   . Hx of scarlet fever 1996  . PVC (premature ventricular contraction)     history of pvc's-started in early preg 5/13 saw [place] -told to reduce caffeine intake-pvc's have stopped  . GERD (gastroesophageal reflux disease)     pregnancy related indigestion-tums prn   Past Surgical History  Procedure Laterality Date  . Cesarean section  2008, 2009  . 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 Wisdom tooth extraction     Family History  Problem Relation Age of Onset  . Heart  disease Mother   . Heart murmur Mother   . Heart disease Father   . Hypertension Father   . Heart attack Father     twice  . Liver cancer Father   . Lung cancer Father     stage 4  . Cancer Father     lung and liver, dx 12/2011  . Anesthesia problems Neg Hx   . Hypotension Neg Hx   . Malignant hyperthermia Neg Hx   . Pseudochol deficiency Neg Hx   . Hearing loss Neg Hx   . Asthma Daughter   . Asthma Son    History  Substance Use Topics  . Smoking status: Current Some Day Smoker -- 0.25 packs/day for 8 years    Types: Cigarettes  . Smokeless tobacco: Never Used  . Alcohol Use: No   OB History   Grav Para Term Preterm Abortions TAB SAB Ect Mult Living   3 3 3       3      Review of Systems  Constitutional: Positive for appetite change. Negative for fever and chills.  Gastrointestinal: Positive for nausea. Negative for vomiting and abdominal pain.  Genitourinary: Negative for dysuria.  Musculoskeletal: Negative for back pain and neck pain.  Skin: Positive for color change.       Abscess.  Neurological: Negative for syncope, weakness, numbness and  headaches.  Psychiatric/Behavioral: Negative.   All other systems reviewed and are negative.  Allergies  Review of patient's allergies indicates no known allergies.  Home Medications   Prior to Admission medications   Medication Sig Start Date End Date Taking? Authorizing Provider  buPROPion (WELLBUTRIN XL) 300 MG 24 hr tablet Take 300 mg by mouth daily.   Yes Historical Provider, MD  CLINDAGEL 1 % gel Apply 1 application topically as needed (for "boils").  01/25/14  Yes Historical Provider, MD  HYDROcodone-acetaminophen (NORCO/VICODIN) 5-325 MG per tablet Take 1 tablet by mouth every 6 (six) hours as needed for moderate pain or severe pain. 03/07/14  Yes Fayrene Helper, PA-C  ibuprofen (ADVIL,MOTRIN) 200 MG tablet Take 800-1,200 mg by mouth every 8 (eight) hours as needed (pain.).   Yes Historical Provider, MD  rizatriptan  (MAXALT-MLT) 10 MG disintegrating tablet Take 1 tablet (10 mg total) by mouth as needed for migraine. May repeat in 2 hours if needed 12/28/13  Yes Melvyn Novas, MD  clindamycin (CLEOCIN) 150 MG capsule Take 3 capsules (450 mg total) by mouth 3 (three) times daily. 03/21/14   Monte Fantasia, PA-C  HYDROcodone-acetaminophen (NORCO/VICODIN) 5-325 MG per tablet Take 1-2 tablets by mouth every 6 (six) hours as needed for moderate pain or severe pain. 03/21/14   Monte Fantasia, PA-C   BP 141/73  Pulse 94  Temp(Src) 98.8 F (37.1 C) (Oral)  Resp 20  Ht 5\' 3"  (1.6 m)  Wt 210 lb (95.255 kg)  BMI 37.21 kg/m2  SpO2 100%  LMP 03/03/2014  Physical Exam  Nursing note and vitals reviewed. Constitutional: She is oriented to person, place, and time. She appears well-developed and well-nourished. No distress.  HENT:  Head: Normocephalic and atraumatic.  Eyes: Conjunctivae and EOM are normal.  Neck: Neck supple. No tracheal deviation present.  Cardiovascular: Normal rate, regular rhythm, S1 normal, S2 normal and normal heart sounds.   No murmur heard. Pulmonary/Chest: Effort normal and breath sounds normal. No respiratory distress. She has no wheezes. She has no rhonchi. She has no rales.  Abdominal: Soft. There is no tenderness.  Musculoskeletal: Normal range of motion.  Neurological: She is alert and oriented to person, place, and time. She has normal strength. No cranial nerve deficit or sensory deficit.  Skin: Skin is warm and dry.  Abscess noted to left axilla which is approximately 6-8 cm in diameter. Mild erythema noted the patient's axilla, and on the posterior aspect of her left arm. Motor strength 5 out of 5 in her right arm. Radial pulse 2+. Distal sensation intact.  Psychiatric: She has a normal mood and affect. Her behavior is normal.    ED Course  INCISION AND DRAINAGE Date/Time: 03/21/2014 9:57 PM Performed by: Monte Fantasia Authorized by: Monte Fantasia Consent: Verbal consent  obtained. Risks and benefits: risks, benefits and alternatives were discussed Consent given by: patient Patient identity confirmed: verbally with patient Type: abscess Body area: trunk Location details: chest Anesthesia: local infiltration Local anesthetic: lidocaine 2% with epinephrine Anesthetic total: 10 ml Patient sedated: no Scalpel size: 11 Needle gauge: 22 Incision type: single straight Complexity: simple Drainage: purulent and  bloody Drainage amount: moderate Wound treatment: wound left open Packing material: 1/2 in iodoform gauze Patient tolerance: Patient tolerated the procedure well with no immediate complications.   (including critical care time)  DIAGNOSTIC STUDIES: Oxygen Saturation is 100% on RA, normal by my interpretation.    COORDINATION OF CARE: 6:52 PM-Discussed treatment plan which includes lab  work, zofran and pain medication with pt at bedside and pt agreed to plan.   Labs Review Labs Reviewed - No data to display  Imaging Review No results found.   EKG Interpretation None      MDM   Final diagnoses:  Abscess and cellulitis  Abscess of axilla, left   28 year old female returning with an abscess in her left axilla after having I&D for same approximately 2 weeks ago. Abscess is fluctuant, and has some erythema surrounding it. Patient denies fever, chills, vomiting. Patient states she has mild nausea due to pain. Due the fact that this is recurrent abscess, we'll give patient IV clindamycin while she is here. Abscess drained as noted in procedure note.  Patient placed on clindamycin by mouth 3 times a day and discharged. I strongly encouraged patient to followup with her PCP in 2 days for wound recheck. I also encouraged her to followup due to the fact that her clindamycin therapy length will be based on resolution of symptoms. Patient states she is agreeable to this and said she does not have PCP, however does go to her OB office for many primary  care concerns. Advised her that if her OB recommends she sees a family doctor, I provided her with a resource guide for PCP in this area. I stressed the fact that patient should have close followup to ensure resolution of this abscess. I also encouraged patient to return to the ER should her symptoms return, worsen or should she have any questions or concerns.    Filed Vitals:   03/21/14 2056  BP: 141/73  Pulse: 94  Temp:   Resp: 20   Signed,  Ladona MowJoe Mostyn Varnell, PA-C 10:05 PM   I personally performed the services described in this documentation, which was scribed in my presence. The recorded information has been reviewed and is accurate.  Monte FantasiaJoseph W Daijon Wenke, PA-C 03/21/14 2206

## 2014-03-25 NOTE — ED Provider Notes (Signed)
Medical screening examination/treatment/procedure(s) were performed by non-physician practitioner and as supervising physician I was immediately available for consultation/collaboration.   EKG Interpretation None        Candyce Churn III, MD 03/25/14 [phone]

## 2014-04-11 ENCOUNTER — Ambulatory Visit (INDEPENDENT_AMBULATORY_CARE_PROVIDER_SITE_OTHER): Payer: No Typology Code available for payment source | Admitting: Surgery

## 2014-06-04 ENCOUNTER — Encounter (HOSPITAL_COMMUNITY): Payer: Self-pay | Admitting: Emergency Medicine

## 2015-01-07 ENCOUNTER — Emergency Department (HOSPITAL_COMMUNITY)
Admission: EM | Admit: 2015-01-07 | Discharge: 2015-01-07 | Disposition: A | Discharge status: Home / Self Care | Payer: No Typology Code available for payment source | Attending: Emergency Medicine | Admitting: Emergency Medicine

## 2015-01-07 ENCOUNTER — Encounter (HOSPITAL_COMMUNITY): Payer: Self-pay | Admitting: Emergency Medicine

## 2015-01-07 ENCOUNTER — Emergency Department (HOSPITAL_COMMUNITY): Payer: No Typology Code available for payment source

## 2015-01-07 DIAGNOSIS — Y998 Other external cause status: Secondary | ICD-10-CM | POA: Diagnosis not present

## 2015-01-07 DIAGNOSIS — Z792 Long term (current) use of antibiotics: Secondary | ICD-10-CM | POA: Diagnosis not present

## 2015-01-07 DIAGNOSIS — S199XXA Unspecified injury of neck, initial encounter: Secondary | ICD-10-CM | POA: Diagnosis not present

## 2015-01-07 DIAGNOSIS — Z8719 Personal history of other diseases of the digestive system: Secondary | ICD-10-CM | POA: Diagnosis not present

## 2015-01-07 DIAGNOSIS — Y92410 Unspecified street and highway as the place of occurrence of the external cause: Secondary | ICD-10-CM | POA: Insufficient documentation

## 2015-01-07 DIAGNOSIS — G43909 Migraine, unspecified, not intractable, without status migrainosus: Secondary | ICD-10-CM | POA: Insufficient documentation

## 2015-01-07 DIAGNOSIS — Z79899 Other long term (current) drug therapy: Secondary | ICD-10-CM | POA: Insufficient documentation

## 2015-01-07 DIAGNOSIS — F329 Major depressive disorder, single episode, unspecified: Secondary | ICD-10-CM | POA: Insufficient documentation

## 2015-01-07 DIAGNOSIS — M545 Low back pain, unspecified: Secondary | ICD-10-CM

## 2015-01-07 DIAGNOSIS — M5136 Other intervertebral disc degeneration, lumbar region: Secondary | ICD-10-CM | POA: Insufficient documentation

## 2015-01-07 DIAGNOSIS — Z8679 Personal history of other diseases of the circulatory system: Secondary | ICD-10-CM | POA: Insufficient documentation

## 2015-01-07 DIAGNOSIS — F419 Anxiety disorder, unspecified: Secondary | ICD-10-CM | POA: Diagnosis not present

## 2015-01-07 DIAGNOSIS — S3992XA Unspecified injury of lower back, initial encounter: Secondary | ICD-10-CM | POA: Insufficient documentation

## 2015-01-07 DIAGNOSIS — Y9389 Activity, other specified: Secondary | ICD-10-CM | POA: Diagnosis not present

## 2015-01-07 DIAGNOSIS — Z8619 Personal history of other infectious and parasitic diseases: Secondary | ICD-10-CM | POA: Insufficient documentation

## 2015-01-07 DIAGNOSIS — Z720 Tobacco use: Secondary | ICD-10-CM | POA: Insufficient documentation

## 2015-01-07 MED ORDER — OXYCODONE-ACETAMINOPHEN 5-325 MG PO TABS: 1.0000 | ORAL_TABLET | Freq: Four times a day (QID) | ORAL | Status: DC | PRN

## 2015-01-07 MED ORDER — KETOROLAC TROMETHAMINE 30 MG/ML IJ SOLN
30.0000 mg | Freq: Once | INTRAMUSCULAR | Status: AC
  Administered 2015-01-07: 30 mg via INTRAMUSCULAR
  Filled 2015-01-07: qty 1

## 2015-01-07 MED ORDER — GADOBENATE DIMEGLUMINE 529 MG/ML IV SOLN
20.0000 mL | Freq: Once | INTRAVENOUS | Status: AC | PRN
  Administered 2015-01-07: 20 mL via INTRAVENOUS

## 2015-01-07 MED ORDER — MORPHINE SULFATE 4 MG/ML IJ SOLN
4.0000 mg | Freq: Once | INTRAMUSCULAR | Status: AC
  Administered 2015-01-07: 4 mg via INTRAVENOUS
  Filled 2015-01-07: qty 1

## 2015-01-07 NOTE — ED Notes (Signed)
Pt back from MRI 
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
## 2015-01-07 NOTE — ED Notes (Signed)
Pt remains in MRI 
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
## 2015-01-07 NOTE — ED Notes (Signed)
Pt transported to MRI 

## 2015-11-29 ENCOUNTER — Encounter (HOSPITAL_COMMUNITY): Payer: Self-pay | Admitting: *Deleted

## 2015-11-29 ENCOUNTER — Emergency Department (HOSPITAL_COMMUNITY)
Admission: EM | Admit: 2015-11-29 | Discharge: 2015-11-29 | Disposition: A | Discharge status: Home / Self Care | Payer: BLUE CROSS/BLUE SHIELD | Attending: Emergency Medicine | Admitting: Emergency Medicine

## 2015-11-29 DIAGNOSIS — Z79891 Long term (current) use of opiate analgesic: Secondary | ICD-10-CM | POA: Insufficient documentation

## 2015-11-29 DIAGNOSIS — Z793 Long term (current) use of hormonal contraceptives: Secondary | ICD-10-CM | POA: Insufficient documentation

## 2015-11-29 DIAGNOSIS — K219 Gastro-esophageal reflux disease without esophagitis: Secondary | ICD-10-CM | POA: Diagnosis not present

## 2015-11-29 DIAGNOSIS — Z792 Long term (current) use of antibiotics: Secondary | ICD-10-CM | POA: Diagnosis not present

## 2015-11-29 DIAGNOSIS — B977 Papillomavirus as the cause of diseases classified elsewhere: Secondary | ICD-10-CM | POA: Insufficient documentation

## 2015-11-29 DIAGNOSIS — R040 Epistaxis: Secondary | ICD-10-CM | POA: Diagnosis not present

## 2015-11-29 DIAGNOSIS — F17210 Nicotine dependence, cigarettes, uncomplicated: Secondary | ICD-10-CM | POA: Insufficient documentation

## 2015-11-29 DIAGNOSIS — Z79899 Other long term (current) drug therapy: Secondary | ICD-10-CM | POA: Diagnosis not present

## 2015-11-29 DIAGNOSIS — I10 Essential (primary) hypertension: Secondary | ICD-10-CM | POA: Insufficient documentation

## 2015-11-29 DIAGNOSIS — F329 Major depressive disorder, single episode, unspecified: Secondary | ICD-10-CM | POA: Diagnosis not present

## 2015-11-29 NOTE — ED Notes (Signed)
Nose bleed yesterday at work, noted elevated bp with nose bleed, this am bp 168/88, only one nose bleed, c/o headache, blurred vision and dizzy throughout the night
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
## 2015-11-29 NOTE — Discharge Instructions (Signed)
Nosebleed Nosebleeds are common. A nosebleed can be caused by many things, including:  Getting hit hard in the nose.  Infections.  Dryness in your nose.  A dry climate.  Medicines.  Picking your nose.  Your home heating and cooling systems. HOME CARE   Try controlling your nosebleed by pinching your nostrils gently. Do this for at least 10 minutes.  Avoid blowing or sniffing your nose for a number of hours after having a nosebleed.  Do not put gauze inside of your nose yourself. If your nose was packed by your doctor, try to keep the pack inside of your nose until your doctor removes it.  If a gauze pack was used and it starts to fall out, gently replace it or cut off the end of it.  If a balloon catheter was used to pack your nose, do not cut or remove it unless told by your doctor.  Avoid lying down while you are having a nosebleed. Sit up and lean forward.  Use a nasal spray decongestant to help with a nosebleed as told by your doctor.  Do not use petroleum jelly or mineral oil in your nose. These can drip into your lungs.  Keep your house humid by using:  Less air conditioning.  A humidifier.  Aspirin and blood thinners make bleeding more likely. If you are prescribed these medicines and you have nosebleeds, ask your doctor if you should stop taking the medicines or adjust the dose. Do not stop medicines unless told by your doctor.  Resume your normal activities as you are able. Avoid straining, lifting, or bending at your waist for several days.  If your nosebleed was caused by dryness in your nose, use over-the-counter saline nasal spray or gel. If you must use a lubricant:  Choose one that is water-soluble.  Use it only as needed.  Do not use it within several hours of lying down.  Keep all follow-up visits as told by your doctor. This is important. GET HELP IF:  You have a fever.  You get frequent nosebleeds.  You are getting nosebleeds more  often. GET HELP RIGHT AWAY IF:  Your nosebleed lasts longer than 20 minutes.  Your nosebleed occurs after an injury to your face, and your nose looks crooked or broken.  You have unusual bleeding from other parts of your body.  You have unusual bruising on other parts of your body.  You feel light-headed or dizzy.  You become sweaty.  You throw up (vomit) blood.  You have a nosebleed after a head injury.   This information is not intended to replace advice given to you by your health care provider. Make sure you discuss any questions you have with your health care provider.   Document Released: 04/28/2008 Document Revised: 08/10/2014 Document Reviewed: 03/05/2014 Elsevier Interactive Patient Education 2016 [employer]   We nosebleed, recommend saline drops, vaporizer in your room, Neosporin ointment. Suggest walking for exercise, decreasing salt, trying to lose weight for your blood pressure

## 2016-09-02 ENCOUNTER — Ambulatory Visit (INDEPENDENT_AMBULATORY_CARE_PROVIDER_SITE_OTHER): Payer: BLUE CROSS/BLUE SHIELD | Admitting: Neurology

## 2016-09-02 ENCOUNTER — Encounter: Payer: Self-pay | Admitting: Neurology

## 2016-09-02 VITALS — BP 151/92 | HR 86 | Resp 20 | Ht 62.0 in | Wt 210.0 lb

## 2016-09-02 DIAGNOSIS — G4710 Hypersomnia, unspecified: Secondary | ICD-10-CM | POA: Diagnosis not present

## 2016-09-02 DIAGNOSIS — G4730 Sleep apnea, unspecified: Secondary | ICD-10-CM

## 2016-09-02 DIAGNOSIS — F5104 Psychophysiologic insomnia: Secondary | ICD-10-CM

## 2016-09-02 DIAGNOSIS — G932 Benign intracranial hypertension: Secondary | ICD-10-CM

## 2016-09-02 DIAGNOSIS — G43101 Migraine with aura, not intractable, with status migrainosus: Secondary | ICD-10-CM | POA: Diagnosis not present

## 2016-09-02 DIAGNOSIS — G43709 Chronic migraine without aura, not intractable, without status migrainosus: Secondary | ICD-10-CM | POA: Diagnosis not present

## 2016-09-02 DIAGNOSIS — H47321 Drusen of optic disc, right eye: Secondary | ICD-10-CM | POA: Diagnosis not present

## 2016-09-02 DIAGNOSIS — R0683 Snoring: Secondary | ICD-10-CM

## 2016-09-02 DIAGNOSIS — IMO0002 Reserved for concepts with insufficient information to code with codable children: Secondary | ICD-10-CM

## 2016-09-02 MED ORDER — TOPIRAMATE 25 MG PO TABS: 25.0000 mg | ORAL_TABLET | Freq: Two times a day (BID) | ORAL | 5 refills | Status: DC

## 2016-09-02 MED ORDER — RIZATRIPTAN BENZOATE 10 MG PO TBDP: 10.0000 mg | ORAL_TABLET | ORAL | 11 refills | Status: DC | PRN

## 2016-09-02 NOTE — Patient Instructions (Signed)
Recurrent Migraine Headache A migraine headache is very bad, throbbing pain on one or both sides of your head. Recurrent migraines keep coming back. Talk to your doctor about what things may bring on (trigger) your migraine headaches. Follow these instructions at home:  Only take medicines as told by your doctor.  Lie down in a dark, quiet room when you have a migraine.  Keep a journal to find out if certain things bring on migraine headaches. For example, write down:  What you eat and drink.  How much sleep you get.  Any change to your diet or medicines.  Lessen how much alcohol you drink.  Quit smoking if you smoke.  Get enough sleep.  Lessen any stress in your life.  Keep lights dim if bright lights bother you or make your migraines worse. Contact a doctor if:  Medicine does not help your migraines.  Your pain keeps coming back.  You have a fever. Get help right away if:  Your migraine becomes really bad.  You have a stiff neck.  You have trouble seeing.  Your muscles are weak, or you lose muscle control.  You lose your balance or have trouble walking.  You feel like you will pass out (faint), or you pass out.  You have really bad symptoms that are different than your first symptoms. This information is not intended to replace advice given to you by your health care provider. Make sure you discuss any questions you have with your health care provider. Document Released: 04/28/2008 Document Revised: 12/26/2015 Document Reviewed: 03/27/2013 Elsevier Interactive Patient Education  2017 Elsevier Inc.  

## 2016-09-02 NOTE — Progress Notes (Signed)
[facility]INE CL[facility]   Provider:  Melvyn Novas, M D  Referring Provider: Waynard Reeds, MD Primary Care Physician:  Marcelo Baldy, PA-C  Chief Complaint  Patient presents with  . New Patient (Initial Visit)    headaches, wakes up with headaches    HPI:  MELLANIE BEJARANO is a 31 y.o. female , seen here as a referral from Dr.Kendra  Ross / Crecencio Mc, [place] .   Dear Ms. [place], [place].  Mrs. Marzelle Rutten has been seen in our office many years ago and unfortunately I do not have computer access to her old records, notes of her last visit with me. She has a history of anxiety and depression but she had seen me mainly for headaches. Her current primary care provider states that she had some excessive worry and anxiety panic attacks at times and that after she moved to a new home and her husband being often away as a truck driver she felt that her insomnia had exacerbated. She also still suffers from headaches up to 6 times a month. She takes clonazepam as needed for panic attacks but that's about once a week. Melatonin has helped her to sleep longer. But when she awakes from sleep, she may wake with dull, frontal or temporal headaches. She denies photophobia but sometimes feels nauseated, usually not to the point of vomiting. Migraines were treated here 7 or 8 years ago and reduced under the prescribed medication. Maxalt sublingual. Topamax.    She lost insurance coverage for a time, making follow up difficult and could not afford the medication. She has developed before blurring of vision since last seen here and subjectively feels that her peripheral visual field is decreased. In the meantime she has had a third child, now 40 years old.  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  She had mentioned this to her primary care as well, who advised her to have a ophthalmological examination. This should include visual acuity testing,  retinoscopy, and a formal visual field evaluation.  Chief complaint according to patient : " Headaches"   Mrs. Plate describes daily headaches, some days they're present at the time she wakes up in the morning, other days throughout the day, she's also sometimes woken by a headache. Headaches are not global but usually restricted to forehead and temple and may radiate to words the nape of the neck. This seems to be no side preference. She is not identified any triggers and food, caffeine, exercise but has reviewed her diary and watched out for those. Nausea is often present all day, visual aura and floating lights were reported.    Social history:  Married with 3 children, non smoker- husband smokes outside the home. Caffeine : 1 cup of coffee in AM, water and juice all day, no iced tea and no sodas.  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  She works first and second shift, rarely at night. .   Review of Systems: Out of a complete 14 system review, the patient complains of only the following symptoms, and all other reviewed systems are negative. Heart problems GERD,, feeling easily overheated even in a cool environment, insomnia difficulties with sleep onset, lower back pain seem to [facility] orthopedic, already received physical therapy and home exercises no surgical intervention planned, anxiety depression has tried Lexapro, Paxil but did not find relief, Wellbutrin seems to help suppressed, panic attacks respond to Klonopin. Patient has  had continued weight gain, the most increase was during pregnancy and early post nasal. 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 I was able to review her primary care laboratory tests absolutely normal CBC with differential, low creatinine on a comprehensive metabolic panel, normal TSH, normal ST-T test, all negative. Cholesterol total 145,   Epworth score 13/ 24  ,  Fatigue severity score 31  , depression score PHQ 9-13 points.    Social History   Social History  . Marital status: Married    Spouse name: N/A  . Number of children: 2  . 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 Smoking status: Current Some Day Smoker    Packs/day: 0.25    Years: 8.00    Types: Cigarettes  . Smokeless tobacco: Never Used  . Alcohol use Yes     Comment: occ  . Drug use: No  . Sexual activity: Yes    Birth control/ protection: Surgical   Other Topics Concern  . Not on file   Social History Narrative  . No narrative on file    Family History  Problem Relation Age of Onset  . Heart disease Mother   . Heart murmur Mother   . Heart disease Father   . Hypertension Father   . Heart attack Father     twice  . Liver cancer Father   . Lung cancer Father     stage 4  . Cancer Father     lung and liver, dx 12/2011  . Asthma Daughter   . Asthma Son   . Anesthesia problems Neg Hx   . Hypotension Neg Hx   . Malignant hyperthermia Neg Hx   . Pseudochol deficiency Neg Hx   . Hearing loss Neg Hx     Past Medical History:  Diagnosis Date  . Abnormal Pap smear   . Anxiety   . Depression   . GERD (gastroesophageal reflux disease)    pregnancy related indigestion-tums prn  . Gonorrhea   . Headache(784.0)   . HPV (human papilloma virus) infection    age 31  . Hx of scarlet fever 1996  . Migraines   . PVC (premature ventricular contraction)    history of pvc's-started in early preg 5/13 saw [place] -told to reduce caffeine intake-pvc's have stopped    Past Surgical History:  Procedure Laterality Date  . CESAREAN SECTION  2008, 2009  . TUBAL LIGATION    . WISDOM TOOTH EXTRACTION      Current Outpatient Prescriptions  Medication Sig Dispense Refill  . ALTAVERA 0.15-30 MG-MCG tablet Take 1 tablet by mouth every morning.  4  . buPROPion (WELLBUTRIN XL) 150 MG 24 hr tablet Take 450 mg  by mouth daily.    . Multiple Vitamin (MULTIVITAMIN WITH MINERALS) TABS tablet Take 1 tablet by mouth daily.     No current facility-administered medications for this visit.     Allergies as of 09/02/2016  . (No Known Allergies)    Vitals: BP (!) 151/92   Pulse 86   Resp 20   Ht 5\' 2"  (1.575 m)   Wt 210 lb (95.3 kg)   BMI 38.41 kg/m  Last Weight:  Wt Readings from Last 1 Encounters:  09/02/16 210 lb (95.3 kg)   EAV:WUJWBMI:Body mass index is 38.41 kg/m.     Last Height:   Ht Readings from Last  1 Encounters:  09/02/16 5\' 2"  (1.575 m)    Physical exam:  General: The patient is awake, alert and appears not in acute distress. The patient is well groomed. Head: Normocephalic, atraumatic. Neck is supple. Mallampati 4  neck circumference:18. Nasal airflow restricted ,  Retrognathia is seen. Crowded dental status.  Cardiovascular:  Regular rate and rhythm, without  murmurs or carotid bruit, and without distended neck veins. Respiratory: Lungs are clear to auscultation. Skin:  Without evidence of edema, or rash Trunk: BMI is elevated - The patient's posture is stooped .  Neurologic exam : The patient is awake and alert, oriented to place and time.   Memory subjective described as intact.  Mood and affect are appropriate.  Cranial nerves:  taste and smell are intact. Pupils are equal and briskly reactive to light. Funduscopic exam without evidence of pallor or edema, but small drusen next to the optic nerve right over left . Extraocular movements  in vertical and horizontal planes intact and without nystagmus. Visual fields by finger perimetry are restricted -  Hearing to finger rub intact.   Facial sensation intact to fine touch.  Facial motor strength is symmetric and tongue and uvula move midline. Shoulder shrug was symmetrical.   Motor exam: Normal tone, muscle bulk and symmetric strength in all extremities. Sensory:  Fine touch, pinprick and vibration were tested in all  extremities. Proprioception tested in the upper extremities was normal. Coordination: Rapid alternating movements ,Finger-to-nose maneuver  normal without evidence of ataxia, dysmetria or tremor.  Gait and station: Patient walks without assistive device and is able unassisted to climb up to the exam table. Strength within normal limits.  Stance is stable and normal.Tandem gait is unfragmented. Turns with 2-3  Steps. Romberg testing is negative.  Deep tendon reflexes: in the  upper and lower extremities are symmetric and intact. Babinski maneuver response is  downgoing.  The patient was advised of the nature of the diagnosed sleep disorder , the treatment options and risks for general a health and wellness arising from not treating the condition.  I spent more than 40 minutes of face to face time with the patient. Greater than 50% of time was spent in counseling and coordination of care. We have discussed the diagnosis and differential and I answered the patient's questions.     Assessment:  After physical and neurologic examination, review of laboratory studies,  Personal review of imaging studies, reports of other /same  Imaging studies ,  Results of polysomnography/ neurophysiology testing and pre-existing records as far as provided in visit., my assessment is   1) Mrs. Novitski suffers from headaches that are still migrainous, associated with a visual aura and nausea. But these have become a daily occurrence. She has not used Maxalt, and she has not been given a preventive medicine. I will refill both.  2) her peripheral visual field has changed, subjectively and objectively. She has developed optic nerve drusen. I did not see venous nicking or papilledema but she does have risk factors for pseudotumor cerebri every, a condition that affects young obese women, often young mothers, and is expressed as a retro-orbital frontal and temporal headache.  3) insomnia, most likely the insomnia is more  psychologically related, worrying about her children's safety, being afraid of having forgotten something that she was supposed to do, etc. The split between family life with 3 young children and full-time employment as a Engineer, civil (consulting) can easily be overwhelming. But in addition she has reached a morbidly obese  BMI, neck circumference of 18 inches and has developed a high-grade Mallampati with narrow crowded dental status of the lower jaw. For this reason I will also evaluate her for the presence of obstructive sleep apnea. In addition, as she often wakes up with headaches we will need to measure CO2 retention. CO2 retention also is a risk factor to develop benign intracranial hypertension, AKA pseudotumor cerebri.     Plan:  Treatment plan and additional workup :  Topiramate 25 mg bid po, or 2 at night. - patient has had tubal ligation. maxalt refill-  MLT  PSG split with capnography Ophthalmological evaluation with retinal images. Appointment tomorrow.  MRI brain, followed by LP if normal- to determine opening pressure .    Porfirio Mylar Brittan Mapel MD  09/02/2016   CC: Waynard Reeds, Md [address] [place], [place] [address]

## 2016-09-16 ENCOUNTER — Ambulatory Visit
Admission: RE | Admit: 2016-09-16 | Discharge: 2016-09-16 | Disposition: A | Discharge status: Home / Self Care | Payer: BLUE CROSS/BLUE SHIELD | Source: Ambulatory Visit | Attending: Neurology | Admitting: Neurology

## 2016-09-16 DIAGNOSIS — G43709 Chronic migraine without aura, not intractable, without status migrainosus: Secondary | ICD-10-CM

## 2016-09-16 DIAGNOSIS — G4710 Hypersomnia, unspecified: Secondary | ICD-10-CM

## 2016-09-16 DIAGNOSIS — G43101 Migraine with aura, not intractable, with status migrainosus: Secondary | ICD-10-CM

## 2016-09-16 DIAGNOSIS — H47321 Drusen of optic disc, right eye: Secondary | ICD-10-CM

## 2016-09-16 DIAGNOSIS — F5104 Psychophysiologic insomnia: Secondary | ICD-10-CM

## 2016-09-16 DIAGNOSIS — G932 Benign intracranial hypertension: Secondary | ICD-10-CM

## 2016-09-16 DIAGNOSIS — R0683 Snoring: Secondary | ICD-10-CM

## 2016-09-16 DIAGNOSIS — G4730 Sleep apnea, unspecified: Secondary | ICD-10-CM

## 2016-09-16 DIAGNOSIS — IMO0002 Reserved for concepts with insufficient information to code with codable children: Secondary | ICD-10-CM

## 2016-09-16 MED ORDER — GADOBENATE DIMEGLUMINE 529 MG/ML IV SOLN
18.0000 mL | Freq: Once | INTRAVENOUS | Status: AC | PRN
  Administered 2016-09-16: 18 mL via INTRAVENOUS

## 2016-09-21 ENCOUNTER — Encounter: Payer: BLUE CROSS/BLUE SHIELD | Admitting: Neurology

## 2016-09-24 ENCOUNTER — Telehealth: Payer: Self-pay

## 2016-09-24 NOTE — Telephone Encounter (Signed)
I spoke to patient and she is aware of results.  

## 2016-09-24 NOTE — Telephone Encounter (Signed)
-----   Message from Melvyn Novasarmen Dohmeier, MD sent at 09/22/2016  1:14 PM EST ----- This MRI of the brain with and without contrast shows the following: 1.    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 Adjacent brain appears normal. 2.    The brain is otherwise normal.  Lafonda Mossesiana- please call.

## 2016-10-19 ENCOUNTER — Ambulatory Visit (INDEPENDENT_AMBULATORY_CARE_PROVIDER_SITE_OTHER): Payer: BLUE CROSS/BLUE SHIELD | Admitting: Neurology

## 2016-10-19 ENCOUNTER — Encounter: Payer: Self-pay | Admitting: Neurology

## 2016-10-19 ENCOUNTER — Encounter (INDEPENDENT_AMBULATORY_CARE_PROVIDER_SITE_OTHER): Payer: Self-pay

## 2016-10-19 VITALS — BP 146/84 | HR 81 | Resp 20 | Ht 62.0 in | Wt 211.0 lb

## 2016-10-19 DIAGNOSIS — H47329 Drusen of optic disc, unspecified eye: Secondary | ICD-10-CM | POA: Diagnosis not present

## 2016-10-19 DIAGNOSIS — R0683 Snoring: Secondary | ICD-10-CM | POA: Insufficient documentation

## 2016-10-19 DIAGNOSIS — R51 Headache: Secondary | ICD-10-CM | POA: Diagnosis not present

## 2016-10-19 DIAGNOSIS — R519 Headache, unspecified: Secondary | ICD-10-CM | POA: Insufficient documentation

## 2016-10-19 DIAGNOSIS — G43101 Migraine with aura, not intractable, with status migrainosus: Secondary | ICD-10-CM | POA: Diagnosis not present

## 2016-10-19 DIAGNOSIS — G43109 Migraine with aura, not intractable, without status migrainosus: Secondary | ICD-10-CM

## 2016-10-19 DIAGNOSIS — H539 Unspecified visual disturbance: Secondary | ICD-10-CM | POA: Insufficient documentation

## 2016-10-19 MED ORDER — TOPIRAMATE 50 MG PO TABS: 50.0000 mg | ORAL_TABLET | Freq: Two times a day (BID) | ORAL | 5 refills | Status: DC

## 2016-10-19 NOTE — Progress Notes (Signed)
[facility]EP [facility]   Provider:  Melvyn Novas, M D  Referring Provider: Jesse Fall Primary Care Physician:  Marcelo Baldy, PA-C  Chief Complaint  Patient presents with  . Follow-up    headaches are getting worse    HPI:  [name] is a 31 y.o. female , seen here as a referral from Dr.Kendra  Mauney / Crecencio Mc, PA .   Dear Ms. [place], [place].  [name] has been seen in our office many years ago and unfortunately I do not have computer access to her old records, notes of her last visit . She has a history of anxiety and depression but she had seen me mainly for headaches. Her current primary care provider states that she had some excessive worry and anxiety panic attacks at times and that after she moved to a new home and her husband being often away as a truck driver she felt that her insomnia had exacerbated. She also still suffers from headaches up to 6 times a month. She takes clonazepam as needed for panic attacks but that's about once a week. Melatonin has helped her to sleep longer. But when she awakes from sleep, she may wake with dull, frontal or temporal headaches. She denies photophobia but sometimes feels nauseated, usually not to the point of vomiting. Migraines were treated here 7 or 8 years ago and reduced under the prescribed medication. Maxalt sublingual. Topamax.    She lost insurance coverage for a time, making follow up difficult and could not afford the medication. She has developed before blurring of vision since last seen here and subjectively feels that her peripheral visual field is decreased. In the meantime she has had a third child, now 50 years old.  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  She had mentioned this to her primary care as well, who advised her to have a ophthalmological examination. This should include visual acuity testing, retinoscopy, and a formal  visual field evaluation. [name] describes daily headaches, some days they're present at the time she wakes up in the morning, other days throughout the day, she's also sometimes woken by a headache. Headaches are not global but usually restricted to forehead and temple and may radiate to words the nape of the neck. This seems to be no side preference. She is not identified any triggers and food, caffeine, exercise but has reviewed her diary and watched out for those. Nausea is often present all day, visual aura and floating lights were reported.   Interval history from 10/19/2016, I am meeting today in a follow-up visit with [name], who underwent MRI evaluation of the brain on the 14th of Fabry 2018. 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 The cyst is quite visible, does not cause adjacent edema and is of 12 x 15 x 9 mm size. The patient headaches however does do not correlate to the right had a much more of a generalized headache. After her visit in January with me she had an ophthalmology appointment and actually was prescribed corrective glasses, but she has not found that these have helped her headaches. Sleep study was difficult  not valid- HST had only 3 hours of recording. Repeat study did not work either. I want her to come to the lab - for CO2 motitoring . I strongly suspect that [name] will have obstructive  sleep apnea found and needs to be treated, the brain cyst will not influence treatment options as it doesn't include or exclude any medication categories used in the treatment of headaches.    Review of Systems: Out of a complete 14 system review, the patient complains of only the following symptoms, and all other reviewed systems are negative. Heart problems GERD,, feeling easily overheated even in a cool environment, insomnia difficulties with sleep onset, lower back pain seem to [facility] orthopedic, already  received physical therapy and home exercises no surgical intervention planned,  anxiety depression has tried Lexapro, Paxil but did not find relief, Wellbutrin seems to help suppressed, panic attacks respond to Klonopin.  Patient has lost a little weight -, the most increase was during pregnancy and early post nasal. 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 I was able to review her primary care laboratory tests absolutely normal CBC with differential, low creatinine on a comprehensive metabolic panel, normal TSH, normal ST-T test, all negative. Cholesterol total 145,   Epworth score 13/ 24  , Fatigue severity score 31  , depression score PHQ 9-13 points.    Social History   Social History  . Marital status: Married    Spouse name: N/A  . Number of children: 2  . 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 Smoking status: Current Some Day Smoker    Packs/day: 0.25    Years: 8.00    Types: Cigarettes  . Smokeless tobacco: Never Used  . Alcohol use Yes     Comment: occ  . Drug use: No  . Sexual activity: Yes    Birth control/ protection: Surgical   Other Topics Concern  . Not on file   Social History Narrative  . No narrative on file    Family History  Problem Relation Age of Onset  . Heart disease Mother   . Heart murmur Mother   . Heart disease Father   . Hypertension Father   . Heart attack Father     twice  . Liver cancer Father   . Lung cancer Father     stage 4  . Cancer Father     lung and liver, dx 12/2011  . Asthma Daughter   . Asthma Son   . Anesthesia problems Neg Hx   . Hypotension Neg Hx   . Malignant hyperthermia Neg Hx   . Pseudochol deficiency Neg Hx   . Hearing loss Neg Hx     Past Medical History:  Diagnosis Date  . Abnormal Pap smear   . Anxiety   . Depression   . GERD (gastroesophageal reflux disease)     pregnancy related indigestion-tums prn  . Gonorrhea   . Headache(784.0)   . HPV (human papilloma virus) infection    age 65  . Hx of scarlet fever 1996  . Migraines   . PVC (premature ventricular contraction)    history of pvc's-started in early preg 5/13 saw [facility] -told to reduce caffeine intake-pvc's have stopped    Past Surgical History:  Procedure Laterality Date  . CESAREAN SECTION  2008, 2009  . TUBAL LIGATION    . WISDOM TOOTH EXTRACTION      Current Outpatient Prescriptions  Medication Sig Dispense Refill  . ALTAVERA 0.15-30 MG-MCG tablet Take 1 tablet by mouth every morning.  4  . buPROPion (WELLBUTRIN XL) 150 MG 24  hr tablet Take 450 mg by mouth daily.    . Multiple Vitamin (MULTIVITAMIN WITH MINERALS) TABS tablet Take 1 tablet by mouth daily.    . rizatriptan (MAXALT-MLT) 10 MG disintegrating tablet Take 1 tablet (10 mg total) by mouth as needed for migraine. May repeat in 2 hours if needed 9 tablet 11  . topiramate (TOPAMAX) 25 MG tablet Take 1 tablet (25 mg total) by mouth 2 (two) times daily. 60 tablet 5   No current facility-administered medications for this visit.     Allergies as of 10/19/2016  . (No Known Allergies)    Vitals: BP (!) 146/84   Pulse 81   Resp 20   Ht 5\' 2"  (1.575 m)   Wt 211 lb (95.7 kg)   BMI 38.59 kg/m  Last Weight:  Wt Readings from Last 1 Encounters:  10/19/16 211 lb (95.7 kg)   JXB:JYNW mass index is 38.59 kg/m.     Last Height:   Ht Readings from Last 1 Encounters:  10/19/16 5\' 2"  (1.575 m)    Physical exam:  General: The patient is awake, alert and appears not in acute distress. The patient is well groomed. Head: Normocephalic, atraumatic. Neck is supple. Mallampati 4  neck circumference:18. Nasal airflow restricted , Retrognathia is seen. Crowded dental status.  Cardiovascular:  Regular rate and rhythm, without  murmurs or carotid bruit, and without distended neck veins. Respiratory: Lungs are clear to  auscultation. Trunk: BMI is elevated - The patient's posture is stooped .  Neurologic exam : The patient is awake and alert, oriented to place and time.   Memory subjective described as intact.  Mood and affect are appropriate.  Cranial nerves:  Taste and smell are intact. Pupils are equal and briskly reactive to light.  Funduscopic exam  small drusen next to the optic nerve right over left .  Extraocular movements  in vertical and horizontal planes intact and without nystagmus. Visual fields by finger perimetry are restricted -  Hearing to finger rub intact.   Facial sensation intact to fine touch.  Facial motor strength is symmetric and tongue and uvula move midline. Shoulder shrug was symmetrical.        Assessment:  After physical and neurologic examination, review of laboratory studies,  Personal review of imaging studies, reports of other /same  Imaging studies ,  Results of polysomnography/ neurophysiology testing and pre-existing records as far as provided in visit., my assessment is   1) Mrs. Finfrock suffers from headaches that are still migrainous, associated with a visual aura and nausea. But these have become a daily occurrence. She has not used Maxalt, and she had not been given a preventive medicine. I refilled maxalt,  I will ask her to increase Topamax-to 50 mg bid.   2) her peripheral visual field has changed, subjectively and objectively. She has developed optic nerve drusen. I did not see venous nicking or papilledema but she does have risk factors for pseudotumor cerebri every, a condition that affects young obese women, often young mothers, and is expressed as a retro-orbital frontal and temporal headache. Her ophthalmologist found "no restriction of the visual field ", according to patient. Her eye doctor [place] located at a mall, not an MD> [facility]ax eye care. [address]  Referral to ophthalmology   3) insomnia, taking melatonin- which has improved her sleep duration to  6  hours   BMI- But in addition she has reached a morbidly obese BMI, neck circumference of 18 inches and  has developed a high-grade Mallampati with narrow crowded dental status of the lower jaw. For this reason I will also evaluate her for the presence of obstructive sleep apnea. In addition, as she often wakes up with headaches we will need to measure CO2 retention. CO2 retention also is a risk factor to develop benign intracranial hypertension, AKA pseudotumor cerebri.   The patient was advised of the nature of the diagnosed sleep disorder , the treatment options and risks for general a health and wellness arising from not treating the condition.  I spent more than 15 minutes of face to face time with the patient. Greater than 50% of time was spent in counseling and coordination of care. We have discussed the diagnosis and differential and I answered the patient's questions.   Plan:  Treatment plan and additional workup :  Topiramate 50 mg either 2 at night or bid . - patient has had tubal ligation. maxalt refill-  MLT . Ophthalmology referral.      Melvyn Novasarmen Anel Creighton MD  10/19/2016   CC: Marcelo BaldyJessica S Mauney, Pa-c [address] [place], [place] [id number]

## 2016-10-22 ENCOUNTER — Telehealth: Payer: Self-pay

## 2016-10-22 NOTE — Telephone Encounter (Signed)
Patient did not show up to get her HST. This is her 3rd time not showing for a repeat HST.

## 2016-11-17 ENCOUNTER — Ambulatory Visit: Payer: BLUE CROSS/BLUE SHIELD | Admitting: Nurse Practitioner

## 2016-11-17 ENCOUNTER — Telehealth: Payer: Self-pay | Admitting: *Deleted

## 2016-11-17 ENCOUNTER — Telehealth: Payer: Self-pay | Admitting: Neurology

## 2016-11-17 NOTE — Telephone Encounter (Signed)
LVM informing patient the office is closed this morning due to power outage. Advised she call later today or tomorrow to reschedule. Also advised this RN will try to reach her as well when office reopens. Left office number.

## 2016-11-17 NOTE — Telephone Encounter (Signed)
Dr. Thayer Ohm office has called patient  X 4 about scheduling patient has not returned any telephone call's to schedule.

## 2016-11-18 NOTE — Telephone Encounter (Signed)
I've never seen this patient, thanks

## 2016-11-18 NOTE — Telephone Encounter (Signed)
Dr. Dohmeier patient  

## 2016-11-30 ENCOUNTER — Encounter (HOSPITAL_COMMUNITY): Payer: Self-pay | Admitting: Emergency Medicine

## 2016-11-30 ENCOUNTER — Emergency Department (HOSPITAL_COMMUNITY)
Admission: EM | Admit: 2016-11-30 | Discharge: 2016-11-30 | Disposition: A | Discharge status: Home / Self Care | Payer: BLUE CROSS/BLUE SHIELD | Attending: Emergency Medicine | Admitting: Emergency Medicine

## 2016-11-30 DIAGNOSIS — G43109 Migraine with aura, not intractable, without status migrainosus: Secondary | ICD-10-CM

## 2016-11-30 DIAGNOSIS — F17210 Nicotine dependence, cigarettes, uncomplicated: Secondary | ICD-10-CM | POA: Insufficient documentation

## 2016-11-30 DIAGNOSIS — R51 Headache: Secondary | ICD-10-CM | POA: Diagnosis present

## 2016-11-30 MED ORDER — KETOROLAC TROMETHAMINE 30 MG/ML IJ SOLN
30.0000 mg | Freq: Once | INTRAMUSCULAR | Status: AC
  Administered 2016-11-30: 30 mg via INTRAVENOUS
  Filled 2016-11-30: qty 1

## 2016-11-30 MED ORDER — DIPHENHYDRAMINE HCL 50 MG/ML IJ SOLN
50.0000 mg | Freq: Once | INTRAMUSCULAR | Status: AC
  Administered 2016-11-30: 50 mg via INTRAVENOUS
  Filled 2016-11-30: qty 1

## 2016-11-30 MED ORDER — METOCLOPRAMIDE HCL 5 MG/ML IJ SOLN
10.0000 mg | Freq: Once | INTRAMUSCULAR | Status: AC
  Administered 2016-11-30: 10 mg via INTRAVENOUS
  Filled 2016-11-30: qty 2

## 2016-11-30 NOTE — ED Triage Notes (Signed)
patient started having right lateral anterior headache that started last night. patient took her medication for HA/migraines last night which helped but today while at work HA came back and states that she cant take her medication due to making her drowsy. Patient reports that she also was told has cyst in brain.

## 2016-11-30 NOTE — ED Provider Notes (Signed)
Emergency Department Provider Note   I have reviewed the triage vital signs and the nursing notes.   HISTORY  Chief Complaint Headache   HPI [name] is a 31 y.o. female with past medical history of anxiety, depression, migraine headache, presents to the emergency department for evaluation of right sided, throbbing headache that is been intermittent over the past 24 hours. Last week she had a similar headache. She states this is different from her migraines and that her migraine headaches typically resolve with Maxalt. She saw her neurologist in February and had an MRI done which showed a benign cyst on the right side. No surrounding edema. She was referred to an ophthalmologist but has not seen them yet. She was also referred for a sleep study but has not been able to have a complete study as of yet. She does continue to take her Topamax twice a day. She reports feeling "warm" with HA onset and did record a fever last week. No neck stiffness. The patient will occasionally have vision changes with onset of symptoms. No weakness, numbness, or gait instability.    Past Medical History:  Diagnosis Date  . Abnormal Pap smear   . Anxiety   . Depression   . GERD (gastroesophageal reflux disease)    pregnancy related indigestion-tums prn  . Gonorrhea   . Headache(784.0)   . HPV (human papilloma virus) infection    age 84  . Hx of scarlet fever 1996  . Migraines   . PVC (premature ventricular contraction)    history of pvc's-started in early preg 5/13 saw [place] -told to reduce caffeine intake-pvc's have stopped    Patient Active Problem List   Diagnosis Date Noted  . Vision changes 10/19/2016  . Snoring 10/19/2016  . Sleep related headaches 10/19/2016  . Morbid obesity (HCC) 10/19/2016  . BMI 35.0-35.9,adult 02/17/2013  . Spondylolisthesis at L5-S1 level-grade 1 02/17/2013  . Migraine with aura and with status migrainosus, not intractable 12/16/2012  . Tension headache  12/16/2012  . Round ligament pain 01/11/2012  . Back pain in pregnancy 01/11/2012  . Heart palpitations 12/11/2011    Past Surgical History:  Procedure Laterality Date  . CESAREAN SECTION  2008, 2009  . TUBAL LIGATION    . WISDOM TOOTH EXTRACTION      Current Outpatient Rx  . Order #: [id number] Class: Historical Med  . Order #: [id number] Class: Historical Med  . Order #: [id number] Class: Historical Med  . Order #: [id number] Class: Historical Med  . Order #: [id number] Class: Historical Med  . Order #: [id number] Class: Historical Med  . Order #: [id number] Class: Normal  . Order #: [id number] Class: Normal    Allergies Patient has no known allergies.  Family History  Problem Relation Age of Onset  . Heart disease Mother   . Heart murmur Mother   . Heart disease Father   . Hypertension Father   . Heart attack Father     twice  . Liver cancer Father   . Lung cancer Father     stage 4  . Cancer Father     lung and liver, dx 12/2011  . Asthma Daughter   . Asthma Son   . Anesthesia problems Neg Hx   . Hypotension Neg Hx   . Malignant hyperthermia Neg Hx   . Pseudochol deficiency Neg Hx   . Hearing loss Neg Hx     Social History Social History  Substance Use Topics  . Smoking status: Current Some Day Smoker  Packs/day: 0.25    Years: 8.00    Types: Cigarettes  . Smokeless tobacco: Never Used  . Alcohol use Yes     Comment: occ    Review of Systems: Constitutional: No fever/chills Eyes: No visual changes. ENT: No sore throat. Cardiovascular: Denies chest pain. Respiratory: Denies shortness of breath. Gastrointestinal: No abdominal pain.  No nausea, no vomiting.  No diarrhea.  No constipation. Genitourinary: Negative for dysuria. Musculoskeletal: Negative for back pain. Skin: Negative for rash. Neurological: Negative for focal weakness or numbness. Positive HA.   10-point ROS otherwise negative.  ____________________________________________   PHYSICAL  EXAM:  VITAL SIGNS: ED Triage Vitals  Enc Vitals Group     BP 11/30/16 1535 (!) 143/87     Pulse Rate 11/30/16 1535 84     Resp 11/30/16 1535 14     Temp 11/30/16 1535 98.8 F (37.1 C)     Temp Source 11/30/16 1535 Oral     SpO2 11/30/16 1535 98 %     Weight 11/30/16 1535 217 lb (98.4 kg)     Height 11/30/16 1535  (1.6 m)     Pain Score 11/30/16 1547 7   Constitutional: Alert and oriented. Well appearing and in no acute distress. Eyes: Conjunctivae are normal. PERRL. EOMI. Head: Atraumatic. Nose: No congestion/rhinnorhea. Mouth/Throat: Mucous membranes are moist.  Oropharynx non-erythematous. Neck: No stridor.  No meningeal signs.   Cardiovascular: Normal rate, regular rhythm. Good peripheral circulation. Grossly normal heart sounds.   Respiratory: Normal respiratory effort.  No retractions. Lungs CTAB. Gastrointestinal: Soft and nontender. No distention.  Musculoskeletal: No lower extremity tenderness nor edema. No gross deformities of extremities. Neurologic:  Normal speech and language. No gross focal neurologic deficits are appreciated. Normal gait.  Skin:  Skin is warm, dry and intact. No rash noted. Psychiatric: Mood and affect are normal. Speech and behavior are normal  ____________________________________________   PROCEDURES  Procedure(s) performed:   Procedures  None ____________________________________________   INITIAL IMPRESSION / ASSESSMENT AND PLAN / ED COURSE  Pertinent labs & imaging results that were available during my care of the patient were reviewed by me and considered in my medical decision making (see chart for details).  Patient presents to the emergency department for evaluation of migraine type headache over the last 24 hours. She has tried home abortive therapies with no relief. No focal deficits on my exam. Patient did have an MRI done in February of this year which I reviewed. It did show a benign cyst with no edema. TM is nonfocal  and symptoms seem consistent with migraine headache. Very low suspicion for [facility] or other emergency type HA. No indication for CT or repeat MRI at this time. Plan for abortive therapy at home and reassess.   06:01 PM Patient's feeling much better after medications. Discussed follow-up plan with neurology and ophthalmology. She is sitting up in bed and very well appearing.   At this time, I do not feel there is any life-threatening condition present. I have reviewed and discussed all results (EKG, imaging, lab, urine as appropriate), exam findings with patient. I have reviewed nursing notes and appropriate previous records.  I feel the patient is safe to be discharged home without further emergent workup. Discussed usual and customary return precautions. Patient and family (if present) verbalize understanding and are comfortable with this plan.  Patient will follow-up with their primary care provider. If they do not have a primary care provider, information for follow-up has been provided to them.  All questions have been answered.  ____________________________________________  FINAL CLINICAL IMPRESSION(S) / ED DIAGNOSES  Final diagnoses:  Migraine with aura and without status migrainosus, not intractable     MEDICATIONS GIVEN DURING THIS VISIT:  Medications  ketorolac (TORADOL) 30 MG/ML injection 30 mg (30 mg Intravenous Given 11/30/16 1640)  metoCLOPramide (REGLAN) injection 10 mg (10 mg Intravenous Given 11/30/16 1640)  diphenhydrAMINE (BENADRYL) injection 50 mg (50 mg Intravenous Given 11/30/16 1640)     NEW OUTPATIENT MEDICATIONS STARTED DURING THIS VISIT:  None   Note:  This document was prepared using Dragon voice recognition software and may include unintentional dictation errors.  Alona Bene, MD Emergency Medicine   Maia Plan, MD 12/01/16 [phone]

## 2016-11-30 NOTE — Discharge Instructions (Signed)
You have been seen in the Emergency Department (ED) for a migraine.  Please use Tylenol or Motrin as needed for symptoms, but only as written on the box, and take any regular medications that have been prescribed for you. °As we have discussed, please follow up with your doctor as soon as possible regarding today’s ED visit and your headache symptoms.   ° °Call your doctor or return to the Emergency Department (ED) if you have a worsening headache, sudden and severe headache, confusion, slurred speech, facial droop, weakness or numbness in any arm or leg, extreme fatigue, or other symptoms that concern you. ° ° °Migraine Headache °A migraine headache is an intense, throbbing pain on one or both sides of your head. A migraine can last for 30 minutes to several hours. °CAUSES  °The exact cause of a migraine headache is not always known. However, a migraine may be caused when nerves in the brain become irritated and release chemicals that cause inflammation. This causes pain. °Certain things may also trigger migraines, such as: °· Alcohol. °· Smoking. °· Stress. °· Menstruation. °· Aged cheeses. °· Foods or drinks that contain nitrates, glutamate, aspartame, or tyramine. °· Lack of sleep. °· Chocolate. °· Caffeine. °· Hunger. °· Physical exertion. °· Fatigue. °· Medicines used to treat chest pain (nitroglycerine), birth control pills, estrogen, and some blood pressure medicines. °SIGNS AND SYMPTOMS °· Pain on one or both sides of your head. °· Pulsating or throbbing pain. °· Severe pain that prevents daily activities. °· Pain that is aggravated by any physical activity. °· Nausea, vomiting, or both. °· Dizziness. °· Pain with exposure to bright lights, loud noises, or activity. °· General sensitivity to bright lights, loud noises, or smells. °Before you get a migraine, you may get warning signs that a migraine is coming (aura). An aura may include: °· Seeing flashing lights. °· Seeing bright spots, halos, or zigzag  lines. °· Having tunnel vision or blurred vision. °· Having feelings of numbness or tingling. °· Having trouble talking. °· Having muscle weakness. °DIAGNOSIS  °A migraine headache is often diagnosed based on: °· Symptoms. °· Physical exam. °· A CT scan or MRI of your head. These imaging tests cannot diagnose migraines, but they can help rule out other causes of headaches. °TREATMENT °Medicines may be given for pain and nausea. Medicines can also be given to help prevent recurrent migraines.  °HOME CARE INSTRUCTIONS °· Only take over-the-counter or prescription medicines for pain or discomfort as directed by your health care provider. The use of [place]-term narcotics is not recommended. °· Lie down in a dark, quiet room when you have a migraine. °· Keep a journal to find out what may trigger your migraine headaches. For example, write down: °¨ What you eat and drink. °¨ How much sleep you get. °¨ Any change to your diet or medicines. °· Limit alcohol consumption. °· Quit smoking if you smoke. °· Get 7-9 hours of sleep, or as recommended by your health care provider. °· Limit stress. °· Keep lights dim if bright lights bother you and make your migraines worse. °SEEK IMMEDIATE MEDICAL CARE IF:  °· Your migraine becomes severe. °· You have a fever. °· You have a stiff neck. °· You have vision loss. °· You have muscular weakness or loss of muscle control. °· You start losing your balance or have trouble walking. °· You feel faint or pass out. °· You have severe symptoms that are different from your first symptoms. °MAKE SURE YOU:  °·   Understand these instructions. °· Will watch your condition. °· Will get help right away if you are not doing well or get worse. °  °This information is not intended to replace advice given to you by your health care provider. Make sure you discuss any questions you have with your health care provider. °  °Document Released: 07/20/2005 Document Revised: 08/10/2014 Document Reviewed:  03/27/2013 °Elsevier Interactive Patient Education ©2016 [employer] ° ° ° °

## 2017-12-06 ENCOUNTER — Other Ambulatory Visit: Payer: Self-pay | Admitting: Neurology

## 2018-05-18 ENCOUNTER — Other Ambulatory Visit: Payer: Self-pay | Admitting: Occupational Medicine

## 2018-05-18 ENCOUNTER — Ambulatory Visit: Payer: Self-pay

## 2018-05-18 DIAGNOSIS — M545 Low back pain, unspecified: Secondary | ICD-10-CM

## 2018-05-25 ENCOUNTER — Ambulatory Visit: Admit: 2018-05-25 | Payer: BLUE CROSS/BLUE SHIELD | Admitting: Obstetrics and Gynecology

## 2018-05-25 SURGERY — XI ROBOTIC ASSISTED LAPAROSCOPIC HYSTERECTOMY AND SALPINGECTOMY
Anesthesia: Choice

## 2018-08-07 ENCOUNTER — Encounter (HOSPITAL_COMMUNITY): Payer: Self-pay | Admitting: Emergency Medicine

## 2018-08-07 ENCOUNTER — Emergency Department (HOSPITAL_COMMUNITY)
Admission: EM | Admit: 2018-08-07 | Discharge: 2018-08-07 | Disposition: A | Discharge status: Home / Self Care | Payer: BLUE CROSS/BLUE SHIELD | Attending: Emergency Medicine | Admitting: Emergency Medicine

## 2018-08-07 ENCOUNTER — Other Ambulatory Visit: Payer: Self-pay

## 2018-08-07 DIAGNOSIS — F17210 Nicotine dependence, cigarettes, uncomplicated: Secondary | ICD-10-CM | POA: Diagnosis not present

## 2018-08-07 DIAGNOSIS — G43909 Migraine, unspecified, not intractable, without status migrainosus: Secondary | ICD-10-CM | POA: Diagnosis present

## 2018-08-07 DIAGNOSIS — G43009 Migraine without aura, not intractable, without status migrainosus: Secondary | ICD-10-CM | POA: Insufficient documentation

## 2018-08-07 DIAGNOSIS — Z79899 Other long term (current) drug therapy: Secondary | ICD-10-CM | POA: Insufficient documentation

## 2018-08-07 MED ORDER — KETOROLAC TROMETHAMINE 30 MG/ML IJ SOLN
30.0000 mg | Freq: Once | INTRAMUSCULAR | Status: AC
  Administered 2018-08-07: 30 mg via INTRAVENOUS
  Filled 2018-08-07: qty 1

## 2018-08-07 MED ORDER — SODIUM CHLORIDE 0.9 % IV BOLUS
1000.0000 mL | Freq: Once | INTRAVENOUS | Status: AC
  Administered 2018-08-07: 1000 mL via INTRAVENOUS

## 2018-08-07 MED ORDER — PROMETHAZINE HCL 25 MG/ML IJ SOLN
25.0000 mg | Freq: Once | INTRAMUSCULAR | Status: AC
  Administered 2018-08-07: 25 mg via INTRAVENOUS
  Filled 2018-08-07: qty 1

## 2018-08-07 MED ORDER — RIZATRIPTAN BENZOATE 10 MG PO TBDP: 10.0000 mg | ORAL_TABLET | ORAL | 0 refills | Status: DC | PRN

## 2018-08-07 NOTE — ED Triage Notes (Signed)
Migraine for one week. Pt has history of same, is out of her medications. Pt reports nausea, but denies vomiting or photosensitivity.

## 2018-08-07 NOTE — ED Provider Notes (Signed)
[facility] EMERGENCY DEPARTMENT Provider Note   CSN: [id number] Arrival date & time: 08/07/18  1346     History   Chief Complaint Chief Complaint  Patient presents with  . Migraine    HPI [name] is a 33 y.o. female with history of migraines, anxiety,  presenting to the emergency department today with migraine x 1 week. She describes the pain as unilateral over her right eye into frontal right side of her forehead as a throbbing pain. The pain does not radiate. She is prescribed Maxalt by her pcp for migraines, however she is out of the medication currently. She describes this headache as similar to ones she has had in the past. She has taken OTC medications to include Ibuprofen, Tylenol and sinus medications with minimal relief.   She reports associated nausea, photophobia.  Denies vomiting, visual changes, weakness, numbness, fever, neck pain, rash, sinus tenderness.   Past Medical History:  Diagnosis Date  . Abnormal Pap smear   . Anxiety   . Depression   . GERD (gastroesophageal reflux disease)    pregnancy related indigestion-tums prn  . Gonorrhea   . Headache(784.0)   . HPV (human papilloma virus) infection    age 38  . Hx of scarlet fever 1996  . Migraines   . PVC (premature ventricular contraction)    history of pvc's-started in early preg 5/13 saw [place] -told to reduce caffeine intake-pvc's have stopped    Patient Active Problem List   Diagnosis Date Noted  . Vision changes 10/19/2016  . Snoring 10/19/2016  . Sleep related headaches 10/19/2016  . Morbid obesity (HCC) 10/19/2016  . BMI 35.0-35.9,adult 02/17/2013  . Spondylolisthesis at L5-S1 level-grade 1 02/17/2013  . Migraine with aura and with status migrainosus, not intractable 12/16/2012  . Tension headache 12/16/2012  . Round ligament pain 01/11/2012  . Back pain in pregnancy 01/11/2012  . Heart palpitations 12/11/2011    Past Surgical History:  Procedure Laterality Date  . CESAREAN  SECTION  2008, 2009  . TUBAL LIGATION    . WISDOM TOOTH EXTRACTION       OB History    Gravida  3   Para  3   Term  3   Preterm      AB      Living  3     SAB      TAB      Ectopic      Multiple      Live Births  3            Home Medications    Prior to Admission medications   Medication Sig Start Date End Date Taking? Authorizing Provider  acetaminophen (TYLENOL) 500 MG tablet Take 1,000 mg by mouth every 6 (six) hours as needed.    [provider]  ALTAVERA 0.15-30 MG-MCG tablet Take 1 tablet by mouth every morning. 11/07/15   [provider]  buPROPion (WELLBUTRIN XL) 150 MG 24 hr tablet Take 450 mg by mouth at bedtime.     [provider]  ibuprofen (ADVIL,MOTRIN) 200 MG tablet Take 800 mg by mouth every 6 (six) hours as needed.    [provider]  Melatonin 3 MG TABS Take 3 mg by mouth at bedtime.    [provider]  Multiple Vitamin (MULTIVITAMIN WITH MINERALS) TABS tablet Take 1 tablet by mouth daily.    [provider]  omeprazole (PRILOSEC) 40 MG capsule Take 1 capsule by mouth daily.  [provider]  propranolol (INDERAL) 20 MG tablet Take 1 tablet by mouth daily. 05/08/18   [provider]  rizatriptan (MAXALT-MLT) 10 MG disintegrating tablet Take 1 tablet (10 mg total) by mouth as needed for migraine. May repeat in 2 hours if needed 08/07/18   Albrizze, Yvonna AlanisKaitlyn E, PA-C  topiramate (TOPAMAX) 50 MG tablet Take 1 tablet (50 mg total) by mouth 2 (two) times daily. 10/19/16   Dohmeier, Porfirio Mylararmen, MD  SUMAtriptan (IMITREX) 50 MG tablet Take 50 mg by mouth every 2 (two) hours as needed. Patient states that the physician informed her to stop this medication.  10/11/11  [provider]    Family History Family History  Problem Relation Age of Onset  . Heart disease Mother   . Heart murmur Mother   . Heart disease Father   . Hypertension Father   . Heart attack Father        twice    . Liver cancer Father   . Lung cancer Father        stage 4  . Cancer Father        lung and liver, dx 12/2011  . Asthma Daughter   . Asthma Son   . Anesthesia problems Neg Hx   . Hypotension Neg Hx   . Malignant hyperthermia Neg Hx   . Pseudochol deficiency Neg Hx   . Hearing loss Neg Hx     Social History Social History   Tobacco Use  . Smoking status: Current Some Day Smoker    Packs/day: 0.25    Years: 8.00    Pack years: 2.00    Types: Cigarettes  . Smokeless tobacco: Never Used  Substance Use Topics  . Alcohol use: Yes    Comment: occ  . Drug use: No     Allergies   Patient has no known allergies.   Review of Systems Review of Systems  Constitutional: Negative for chills, fatigue and fever.  HENT: Negative for congestion, sinus pressure and sore throat.   Eyes: Positive for photophobia. Negative for pain and visual disturbance.  Respiratory: Negative for chest tightness and shortness of breath.   Cardiovascular: Negative for chest pain and palpitations.  Gastrointestinal: Positive for nausea. Negative for abdominal pain, diarrhea and vomiting.  Genitourinary: Negative for difficulty urinating and hematuria.  Musculoskeletal: Negative for back pain and neck pain.  Skin: Negative for rash and wound.  Neurological: Positive for headaches. Negative for dizziness, syncope and weakness.     Physical Exam Updated Vital Signs BP (!) 137/96 (BP Location: Right Arm)   Pulse 74   Temp 97.8 F (36.6 C) (Oral)   Resp 18   Ht 5\' 2"  (1.575 m)   Wt 97.5 kg   LMP 07/21/2018   SpO2 97%   BMI 39.32 kg/m   Physical Exam Vitals signs and nursing note reviewed.  Constitutional:      Appearance: She is not ill-appearing or toxic-appearing.  HENT:     Head: Normocephalic and atraumatic.     Nose: Nose normal.     Mouth/Throat:     Mouth: Mucous membranes are moist.     Pharynx: Oropharynx is clear.  Eyes:     Conjunctiva/sclera: Conjunctivae normal.  Neck:      Musculoskeletal: Normal range of motion.  Cardiovascular:     Rate and Rhythm: Normal rate and regular rhythm.     Pulses: Normal pulses.     Heart sounds: Normal heart sounds.  Pulmonary:  Effort: Pulmonary effort is normal.     Breath sounds: Normal breath sounds.  Abdominal:     General: Bowel sounds are normal. There is no distension.     Tenderness: There is no abdominal tenderness. There is no guarding or rebound.  Musculoskeletal: Normal range of motion.  Skin:    General: Skin is warm and dry.     Capillary Refill: Capillary refill takes less than 2 seconds.  Neurological:     Mental Status: She is alert. Mental status is at baseline.     Motor: No weakness.     Comments: Speech is clear and goal oriented, follows commands CN 3-12 intact. No facial droop Normal strength in upper and lower extremities bilaterally including dorsiflexion and plantar flexion, strong and equal grip strength Sensation normal to light touch Moves extremities without ataxia, coordination intact Normal finger to nose and rapid alternating movements. Normal gait and balance   Psychiatric:        Mood and Affect: Mood normal.        Behavior: Behavior normal.        Judgment: Judgment normal.      ED Treatments / Results  Labs (all labs ordered are listed, but only abnormal results are displayed) Labs Reviewed - No data to display  EKG None  Radiology No results found.  Procedures Procedures (including critical care time)  Medications Ordered in ED Medications  sodium chloride 0.9 % bolus 1,000 mL (1,000 mLs Intravenous New Bag/Given 08/07/18 1530)  ketorolac (TORADOL) 30 MG/ML injection 30 mg (30 mg Intravenous Given 08/07/18 1531)  promethazine (PHENERGAN) injection 25 mg (25 mg Intravenous Given 08/07/18 1532)     Initial Impression / Assessment and Plan / ED Course  I have reviewed the triage vital signs and the nursing notes.  Pertinent labs & imaging results that were  available during my care of the patient were reviewed by me and considered in my medical decision making (see chart for details).  Pt HA treated and improved while in ED with IV phenergan and toradol. She has had relief with this combination in previous visits. Presentation is like pts typical HA and non concerning for [facility], ICH, Meningitis. Pt is afebrile with no focal neuro deficits, nuchal rigidity, or change in vision. Pt normally gets Maxalt from her pcp for migraine management but has been unable to get an appointment due to an unpaid copay. She will be given prescription at discharge for Maxalt. Pt still advised to follow up with pcp. Pt verbalizes understanding and is agreeable with plan to dc.   The patient was discussed with and seen by Dr. Hyacinth Meeker who agrees with the treatment plan.        Final Clinical Impressions(s) / ED Diagnoses   Final diagnoses:  Migraine without aura and without status migrainosus, not intractable    ED Discharge Orders         Ordered    rizatriptan (MAXALT-MLT) 10 MG disintegrating tablet  As needed     08/07/18 1622           Albrizze, Mliss Sax 08/08/18 0012    Eber Hong, MD 08/08/18 1747

## 2018-08-07 NOTE — ED Provider Notes (Signed)
Medical screening examination/treatment/procedure(s) were conducted as a shared visit with non-physician practitioner(s) and myself.  I personally evaluated the patient during the encounter.  Clinical Impression:   Final diagnoses:  Migraine without aura and without status migrainosus, not intractable    Has hx of Migraine headaches - frequently uses triptans by her PCP but has been out of her meds - has copay which she can't pay - has had ha for 1 week - using OTC meds with URi meds - brief relief but comes back - some nausea / photophobia.  2 years ago had MRI showing a benign cyst - Neuro f/u with observation - CT a year ago - no cyst seen.  Has had phenergan and toradol in the past - with success. - normal neuro on my exam, Pt agreeable to plan of conservative management and refill on maxalt.   Eber Hong, MD 08/08/18 [phone]

## 2018-08-07 NOTE — Discharge Instructions (Addendum)
You have been seen today for migraine. Please read and follow all provided instructions.   1. Medications: Prescription for Maxalt sent to your pharmacy, continue usual home medications 2. Treatment: rest, drink plenty of fluids 3. Follow Up: Please follow up with your primary doctor in 2-5 days for discussion of your diagnoses and further evaluation after today's visit; if you do not have a primary care doctor use the resource guide provided to find one; Please return to the ER for any new or worsening symptoms. Please obtain all of your results from medical records or have your doctors office obtain the results - share them with your doctor - you should be seen at your doctors office. Call today to arrange your follow up.   Take medications as prescribed. Please review all of the medicines and only take them if you do not have an allergy to them. Return to the emergency room for worsening condition or new concerning symptoms. Follow up with your regular doctor. If you don't have a regular doctor use one of the numbers below to establish a primary care doctor.  Please be aware that if you are taking birth control pills, taking other prescriptions, ESPECIALLY ANTIBIOTICS may make the birth control ineffective - if this is the case, either do not engage in sexual activity or use alternative methods of birth control such as condoms until you have finished the medicine and your family doctor says it is OK to restart them. If you are on a blood thinner such as COUMADIN, be aware that any other medicine that you take may cause the coumadin to either work too much, or not enough - you should have your coumadin level rechecked in next 7 days if this is the case.  ?  It is also a possibility that you have an allergic reaction to any of the medicines that you have been prescribed - Everybody reacts differently to medications and while MOST people have no trouble with most medicines, you may have a reaction such as  nausea, vomiting, rash, swelling, shortness of breath. If this is the case, please stop taking the medicine immediately and contact your physician.  ?  You should return to the ER if you develop severe or worsening symptoms.

## 2018-08-23 ENCOUNTER — Emergency Department (HOSPITAL_COMMUNITY)
Admission: EM | Admit: 2018-08-23 | Discharge: 2018-08-23 | Disposition: A | Discharge status: Home / Self Care | Payer: BLUE CROSS/BLUE SHIELD | Attending: Emergency Medicine | Admitting: Emergency Medicine

## 2018-08-23 ENCOUNTER — Encounter (HOSPITAL_COMMUNITY): Payer: Self-pay

## 2018-08-23 ENCOUNTER — Other Ambulatory Visit: Payer: Self-pay

## 2018-08-23 DIAGNOSIS — F17210 Nicotine dependence, cigarettes, uncomplicated: Secondary | ICD-10-CM | POA: Insufficient documentation

## 2018-08-23 DIAGNOSIS — R05 Cough: Secondary | ICD-10-CM | POA: Diagnosis present

## 2018-08-23 DIAGNOSIS — J111 Influenza due to unidentified influenza virus with other respiratory manifestations: Secondary | ICD-10-CM

## 2018-08-23 DIAGNOSIS — Z79899 Other long term (current) drug therapy: Secondary | ICD-10-CM | POA: Diagnosis not present

## 2018-08-23 LAB — GROUP A STREP BY PCR: GROUP A STREP BY PCR: NOT DETECTED

## 2018-08-23 MED ORDER — OSELTAMIVIR PHOSPHATE 75 MG PO CAPS: 75.0000 mg | ORAL_CAPSULE | Freq: Two times a day (BID) | ORAL | 0 refills | Status: AC

## 2018-08-23 NOTE — Discharge Instructions (Addendum)
Return if any problems.

## 2018-08-23 NOTE — ED Provider Notes (Signed)
[facility]-EMERGENCY DEPT Provider Note   CSN: [id number] Arrival date & time: 08/23/18  [id number]     History   Chief Complaint Chief Complaint  Patient presents with  . Influenza    HPI [name] is a 33 y.o. female.  The history is provided by the patient. No language interpreter was used.  Influenza  Presenting symptoms: cough, fever, nausea and sore throat   Severity:  Moderate Onset quality:  Gradual Progression:  Worsening Chronicity:  New Relieved by:  Nothing Worsened by:  Nothing Associated symptoms: chills   Risk factors: not pregnant     Past Medical History:  Diagnosis Date  . Abnormal Pap smear   . Anxiety   . Depression   . GERD (gastroesophageal reflux disease)    pregnancy related indigestion-tums prn  . Gonorrhea   . Headache(784.0)   . HPV (human papilloma virus) infection    age 33  . Hx of scarlet fever 1996  . Migraines   . PVC (premature ventricular contraction)    history of pvc's-started in early preg 5/13 saw [place] -told to reduce caffeine intake-pvc's have stopped    Patient Active Problem List   Diagnosis Date Noted  . Vision changes 10/19/2016  . Snoring 10/19/2016  . Sleep related headaches 10/19/2016  . Morbid obesity (HCC) 10/19/2016  . BMI 35.0-35.9,adult 02/17/2013  . Spondylolisthesis at L5-S1 level-grade 1 02/17/2013  . Migraine with aura and with status migrainosus, not intractable 12/16/2012  . Tension headache 12/16/2012  . Round ligament pain 01/11/2012  . Back pain in pregnancy 01/11/2012  . Heart palpitations 12/11/2011    Past Surgical History:  Procedure Laterality Date  . CESAREAN SECTION  2008, 2009  . TUBAL LIGATION    . WISDOM TOOTH EXTRACTION       OB History    Gravida  3   Para  3   Term  3   Preterm      AB      Living  3     SAB      TAB      Ectopic      Multiple      Live Births  3            Home Medications    Prior to Admission  medications   Medication Sig Start Date End Date Taking? Authorizing Provider  acetaminophen (TYLENOL) 500 MG tablet Take 1,000 mg by mouth every 6 (six) hours as needed.    [provider]  ALTAVERA 0.15-30 MG-MCG tablet Take 1 tablet by mouth every morning. 11/07/15   [provider]  buPROPion (WELLBUTRIN XL) 150 MG 24 hr tablet Take 450 mg by mouth at bedtime.     [provider]  ibuprofen (ADVIL,MOTRIN) 200 MG tablet Take 800 mg by mouth every 6 (six) hours as needed.    [provider]  Melatonin 3 MG TABS Take 3 mg by mouth at bedtime.    [provider]  Multiple Vitamin (MULTIVITAMIN WITH MINERALS) TABS tablet Take 1 tablet by mouth daily.    [provider]  omeprazole (PRILOSEC) 40 MG capsule Take 1 capsule by mouth daily.    [provider]  propranolol (INDERAL) 20 MG tablet Take 1 tablet by mouth daily. 05/08/18   [provider]  rizatriptan (MAXALT-MLT) 10 MG disintegrating tablet Take 1 tablet (10 mg total) by mouth as needed for migraine. May repeat in 2 hours if needed 08/07/18  Albrizze, Kaitlyn E, PA-C  topiramate (TOPAMAX) 50 MG tablet Take 1 tablet (50 mg total) by mouth 2 (two) times daily. 10/19/16   Dohmeier, Porfirio Mylararmen, MD  SUMAtriptan (IMITREX) 50 MG tablet Take 50 mg by mouth every 2 (two) hours as needed. Patient states that the physician informed her to stop this medication.  10/11/11  [provider]    Family History Family History  Problem Relation Age of Onset  . Heart disease Mother   . Heart murmur Mother   . Heart disease Father   . Hypertension Father   . Heart attack Father        twice  . Liver cancer Father   . Lung cancer Father        stage 4  . Cancer Father        lung and liver, dx 12/2011  . Asthma Daughter   . Asthma Son   . Anesthesia problems Neg Hx   . Hypotension Neg Hx   . Malignant hyperthermia Neg Hx   . Pseudochol deficiency Neg Hx   . Hearing loss Neg Hx      Social History Social History   Tobacco Use  . Smoking status: Current Some Day Smoker    Packs/day: 0.25    Years: 8.00    Pack years: 2.00    Types: Cigarettes  . Smokeless tobacco: Never Used  Substance Use Topics  . Alcohol use: Yes    Comment: occ  . Drug use: No     Allergies   Patient has no known allergies.   Review of Systems Review of Systems  Constitutional: Positive for chills and fever.  HENT: Positive for sore throat.   Respiratory: Positive for cough.   Gastrointestinal: Positive for nausea.  All other systems reviewed and are negative.    Physical Exam Updated Vital Signs BP 133/83   Pulse 72   Temp 98.3 F (36.8 C) (Oral)   Resp 18   SpO2 95%   Physical Exam Vitals signs reviewed.  HENT:     Head: Normocephalic.     Right Ear: Tympanic membrane normal.     Left Ear: Tympanic membrane normal.     Nose: Nose normal.     Mouth/Throat:     Pharynx: Posterior oropharyngeal erythema present.  Eyes:     Pupils: Pupils are equal, round, and reactive to light.  Neck:     Musculoskeletal: Normal range of motion.  Cardiovascular:     Rate and Rhythm: Normal rate.     Pulses: Normal pulses.  Pulmonary:     Effort: Pulmonary effort is normal.  Musculoskeletal: Normal range of motion.  Skin:    General: Skin is warm.  Neurological:     General: No focal deficit present.     Mental Status: She is alert.  Psychiatric:        Mood and Affect: Mood normal.      ED Treatments / Results  Labs (all labs ordered are listed, but only abnormal results are displayed) Labs Reviewed  CULTURE, GROUP A STREP ([facility])  GROUP A STREP BY PCR    EKG None  Radiology No results found.  Procedures Procedures (including critical care time)  Medications Ordered in ED Medications - No data to display   Initial Impression / Assessment and Plan / ED Course  I have reviewed the triage vital signs and the nursing notes.  Pertinent labs &  imaging results that were available during my care of the  patient were reviewed by me and considered in my medical decision making (see chart for details).       Final Clinical Impressions(s) / ED Diagnoses   Final diagnoses:  Influenza    ED Discharge Orders         Ordered    oseltamivir (TAMIFLU) 75 MG capsule  2 times daily     08/23/18 1222        An After Visit Summary was printed and given to the patient.    Elson Areas, [place] 08/23/18 1223    Margarita Grizzle, MD 08/23/18 [phone]

## 2018-08-23 NOTE — ED Triage Notes (Signed)
Pt c/o flu-like symptoms, cough, congestion, body aches, headache since yesterday morning. Niece and nephew DX with flu and strep throat Sunday.

## 2018-11-02 ENCOUNTER — Telehealth: Payer: BLUE CROSS/BLUE SHIELD | Admitting: Nurse Practitioner

## 2018-11-02 DIAGNOSIS — J069 Acute upper respiratory infection, unspecified: Secondary | ICD-10-CM

## 2018-11-02 MED ORDER — FLUTICASONE PROPIONATE 50 MCG/ACT NA SUSP: 2.0000 | Freq: Every day | NASAL | 6 refills | Status: DC

## 2018-11-02 NOTE — Progress Notes (Signed)
We are sorry you are not feeling well.  Here is how we plan to help!  Based on what you have shared with me, it looks like you may have a viral upper respiratory infection.  Upper respiratory infections are caused by a large number of viruses; however, rhinovirus is the most common cause.   Symptoms vary from person to person, with common symptoms including sore throat, cough, and fatigue or lack of energy.  A low-grade fever of up to 100.4 may present, but is often uncommon.  Symptoms vary however, and are closely related to a person's age or underlying illnesses.  The most common symptoms associated with an upper respiratory infection are nasal discharge or congestion, cough, sneezing, headache and pressure in the ears and face.  These symptoms usually persist for about 3 to 10 days, but can last up to 2 weeks.  It is important to know that upper respiratory infections do not cause serious illness or complications in most cases.    Upper respiratory infections can be transmitted from person to person, with the most common method of transmission being a person's hands.  The virus is able to live on the skin and can infect other persons for up to 2 hours after direct contact.  Also, these can be transmitted when someone coughs or sneezes; thus, it is important to cover the mouth to reduce this risk.  To keep the spread of the illness at bay, good hand hygiene is very important.  This is an infection that is most likely caused by a virus. There are no specific treatments other than to help you with the symptoms until the infection runs its course.  We are sorry you are not feeling well.  Here is how we plan to help!   For nasal congestion, you may use an oral decongestants such as Mucinex D or if you have glaucoma or high blood pressure use plain Mucinex.  Saline nasal spray or nasal drops can help and can safely be used as often as needed for congestion.  For your congestion, I have prescribed Fluticasone  nasal spray one spray in each nostril twice a day  If you do not have a history of heart disease, hypertension, diabetes or thyroid disease, prostate/bladder issues or glaucoma, you may also use Sudafed to treat nasal congestion.  It is highly recommended that you consult with a pharmacist or your primary care physician to ensure this medication is safe for you to take.     If you have a cough, you may use cough suppressants such as Delsym and Robitussin.  If you have glaucoma or high blood pressure, you can also use Coricidin HBP.     If you have a sore or scratchy throat, use a saltwater gargle-  to  teaspoon of salt dissolved in a 4-ounce to 8-ounce glass of warm water.  Gargle the solution for approximately 15-30 seconds and then spit.  It is important not to swallow the solution.  You can also use throat lozenges/cough drops and Chloraseptic spray to help with throat pain or discomfort.  Warm or cold liquids can also be helpful in relieving throat pain.  For headache, pain or general discomfort, you can use Ibuprofen or Tylenol as directed.   Some authorities believe that zinc sprays or the use of Echinacea may shorten the course of your symptoms.   HOME CARE . Only take medications as instructed by your medical team. . Be sure to drink plenty of fluids. Water   is fine as well as fruit juices, sodas and electrolyte beverages. You may want to stay away from caffeine or alcohol. If you are nauseated, try taking small sips of liquids. How do you know if you are getting enough fluid? Your urine should be a pale yellow or almost colorless. . Get rest. . Taking a steamy shower or using a humidifier may help nasal congestion and ease sore throat pain. You can place a towel over your head and breathe in the steam from hot water coming from a faucet. . Using a saline nasal spray works much the same way. . Cough drops, hard candies and sore throat lozenges may ease your cough. . Avoid close contacts  especially the very young and the elderly . Cover your mouth if you cough or sneeze . Always remember to wash your hands.   GET HELP RIGHT AWAY IF: . You develop worsening fever. . If your symptoms do not improve within 10 days . You develop yellow or green discharge from your nose over 3 days. . You have coughing fits . You develop a severe head ache or visual changes. . You develop shortness of breath, difficulty breathing or start having chest pain . Your symptoms persist after you have completed your treatment plan  MAKE SURE YOU   Understand these instructions.  Will watch your condition.  Will get help right away if you are not doing well or get worse.  Your e-visit answers were reviewed by a board certified advanced clinical practitioner to complete your personal care plan. Depending upon the condition, your plan could have included both over the counter or prescription medications. Please review your pharmacy choice. If there is a problem, you may call our nursing hot line at and have the prescription routed to another pharmacy. Your safety is important to us. If you have drug allergies check your prescription carefully.   You can use MyChart to ask questions about today's visit, request a non-urgent call back, or ask for a work or school excuse for 24 hours related to this e-Visit. If it has been greater than 24 hours you will need to follow up with your provider, or enter a new e-Visit to address those concerns. You will get an e-mail in the next two days asking about your experience.  I hope that your e-visit has been valuable and will speed your recovery. Thank you for using e-visits.   5 minutes spent reviewing and documenting in chart.    

## 2018-11-19 ENCOUNTER — Telehealth: Payer: BLUE CROSS/BLUE SHIELD | Admitting: Physician Assistant

## 2018-11-19 DIAGNOSIS — F339 Major depressive disorder, recurrent, unspecified: Secondary | ICD-10-CM

## 2018-11-19 NOTE — Progress Notes (Signed)
Patient wanting refill of Wellbutrin. She is to follow-up with PCP or be seen at [facility].

## 2018-11-19 NOTE — Progress Notes (Signed)
Unfortunately we do not refill medications via e-visit. You will need to call your primary care provider's office or be seen in person at Urgent Care to get refills of antidepressant. I would recommend trying to call your PCP office to see if the provider on call will send in a refill of the medication until you can be seen.

## 2019-04-22 ENCOUNTER — Other Ambulatory Visit: Payer: Self-pay | Admitting: Nurse Practitioner

## 2020-01-17 ENCOUNTER — Other Ambulatory Visit: Payer: Self-pay

## 2020-01-17 ENCOUNTER — Institutional Professional Consult (permissible substitution): Payer: Self-pay | Admitting: Neurology

## 2020-01-17 ENCOUNTER — Encounter: Payer: Self-pay | Admitting: Neurology

## 2020-01-17 ENCOUNTER — Ambulatory Visit (INDEPENDENT_AMBULATORY_CARE_PROVIDER_SITE_OTHER): Payer: Self-pay | Admitting: Neurology

## 2020-01-17 VITALS — BP 142/94 | HR 85 | Ht 62.0 in | Wt 233.0 lb

## 2020-01-17 DIAGNOSIS — G932 Benign intracranial hypertension: Secondary | ICD-10-CM

## 2020-01-17 DIAGNOSIS — G43101 Migraine with aura, not intractable, with status migrainosus: Secondary | ICD-10-CM

## 2020-01-17 DIAGNOSIS — E282 Polycystic ovarian syndrome: Secondary | ICD-10-CM

## 2020-01-17 MED ORDER — TOPIRAMATE 25 MG PO TABS: ORAL_TABLET | ORAL | 3 refills | Status: DC

## 2020-01-17 MED ORDER — ALPRAZOLAM 1 MG PO TABS: ORAL_TABLET | ORAL | 0 refills | Status: DC

## 2020-01-17 NOTE — Addendum Note (Signed)
Addended by: Melvyn Novas on: 01/17/2020 10:37 AM   Modules accepted: Orders

## 2020-01-17 NOTE — Progress Notes (Signed)
SLEEP MEDICINE CLINIC    Provider:  Melvyn Novas, MD  Primary Care Physician:  Paschal Dopp, PA [address] [place] [address]     Referring Provider: Maryfrances Bunnell [address] [place],  [place] [address]          Chief Complaint according to patient   Patient presents with:    . New Patient (Initial Visit)     presents today 6/5 she developed floaters in the rt eye. went to  Optometrist and eyes were dilated - there was swelling in optic area. advised her to be seen here to address treatment. she still has the floaters , retro-orbital pressure headaches and pain with eye movement. Nausea.       HISTORY OF PRESENT ILLNESS:  [name] is a 34 y.o. year old Caucasian female patient and seen upon OD  referral  [facility], on 01/17/2020 Chief concern according to patient :   [name] is a 34 year old right- handed Caucasian female with a new onset headache that is described as a retro-orbital pressure.  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  She is not sure what led to the new symptom onset.  She does have several risk factors including a BMI of 42.6, she has elevated blood pressure at baseline she is a smoker, she was not on any tetracycline antibiotics, and not on Retin-A. She remains with impaired vision.  No photophobia presently.   She  has a past medical history of Abnormal Pap smear, Anxiety, Depression, GERD (gastroesophageal reflux disease), , Intracranial pressure Headache(784.0),  Polycystic ovarian syndrome with hirsutism, irregular menses, HPV (human papilloma virus) infection, scarlet fever (1996), Migraines, and PVC (premature ventricular contraction). She is aloud snorer.  A sleep evalution will follow later. .       Family medical  /sleep history: maternal family with HA, migraines.    Social history:  Patient is working as a Engineer, civil (consulting) and lives in a household with 5 persons. Family status is married , with 3 children.  The patient currently works/ day time. Pets are present, cats and dogs. Tobacco use- cig. 2 a day.   ETOH use - seldomly , Caffeine intake in form of Coffee( 1 cup a day ) Soda( 2 a day ) - no energy drinks. Regular exercise- none .       Sleep habits are as follows: The patient's dinner time is between 6-7 PM. The patient goes to bed at 10 PM and continues to sleep for 4-5 hours, wakes for 1 bathroom break.   The preferred sleep position is laterally, with the support of 3 pillows. Dreams are reportedly frequent/vivid.  6.30 AM is the usual rise time. The patient wakes up spontaneously  She reports not feeling refreshed or restored in AM, with symptoms such as dry mouth, morning headaches and sometimes she is woken by headaches. ,  Review of Systems: Out of a complete 14 system review, the patient complains of only the following symptoms, and all other reviewed systems are negative.:  Fatigue, sleepiness , snoring, headaches in sleep and after sleep. Pressure retro-orbitally.    How likely are you to doze in the following situations: 0 = not likely, 1 = slight chance, 2 = moderate chance, 3 = high chance  Sitting and Reading? Watching Television? Sitting inactive in a public place (theater or meeting)? As a passenger in a car for an hour without a break? Lying down in the afternoon when circumstances permit? Sitting and talking to someone? Sitting quietly after lunch without alcohol? In a car, while stopped for a few minutes in traffic?   Total = 11/ 24 points   Social History   Socioeconomic History  . Marital status: Married    Spouse name: Not on file  . Number of children: 2  . Years of education: Not on file  . Highest education level: Not on file  Occupational History  . Occupation:  stay at home mother  Tobacco Use  . Smoking status: Current Every Day Smoker    Packs/day: 0.25    Years: 8.00    Pack years: 2.00    Types: Cigarettes  . Smokeless tobacco: Never Used  . Tobacco comment: 2 cig a day  Vaping Use  . Vaping Use: Never used  Substance and Sexual Activity  . Alcohol use: Yes    Comment: occ  . Drug use: No  . Sexual activity: Yes    Birth control/protection: Surgical  Other Topics Concern  . Not on file  Social History Narrative  . Not on file   Social Determinants of Health   Financial Resource Strain:   . Difficulty of Paying Living Expenses:   Food Insecurity:   . Worried About Programme researcher, broadcasting/film/video in the Last Year:   . Barista in the Last Year:   Transportation Needs:   . Freight forwarder (Medical):   Marland Kitchen Lack of Transportation (Non-Medical):   Physical Activity:   . Days of Exercise per Week:   . Minutes of Exercise per Session:   Stress:   . Feeling of Stress :   Social Connections:   . Frequency of Communication with Friends and Family:   . Frequency of Social Gatherings with Friends and Family:   . Attends Religious Services:   . Active Member of Clubs or Organizations:   . Attends Banker Meetings:   Marland Kitchen Marital Status:     Family History  Problem Relation Age of Onset  . Heart disease Mother   . Heart murmur Mother   . Heart disease Father   . Hypertension Father   . Heart attack Father        twice  . Liver cancer Father   . Lung cancer Father        stage 4  . Cancer Father        lung and liver, dx 12/2011  . Asthma Daughter   . Asthma Son   . Anesthesia problems Neg Hx   . Hypotension Neg Hx   . Malignant hyperthermia Neg Hx   . Pseudochol deficiency Neg Hx   . Hearing loss Neg Hx     Past Medical History:  Diagnosis Date  . Abnormal Pap smear   . Anxiety   . Depression   . GERD (gastroesophageal reflux disease)    pregnancy related indigestion-tums prn  . Gonorrhea   .  Headache(784.0)   . HPV (human papilloma virus) infection    age 70  . Hx of scarlet fever 1996  . Migraines   . PVC (premature ventricular contraction)    history of pvc's-started in early preg 5/13 saw [facility] -told to reduce caffeine intake-pvc's have stopped    Past Surgical History:  Procedure Laterality Date  .  CESAREAN SECTION  2008, 2009  . TUBAL LIGATION    . WISDOM TOOTH EXTRACTION       Current Outpatient Medications on File Prior to Visit  Medication Sig Dispense Refill  . acetaminophen (TYLENOL) 500 MG tablet Take 1,000 mg by mouth every 6 (six) hours as needed.    Marland Kitchen ALTAVERA 0.15-30 MG-MCG tablet Take 1 tablet by mouth every morning.  4  . buPROPion (WELLBUTRIN XL) 150 MG 24 hr tablet Take 450 mg by mouth at bedtime.     . fluticasone (FLONASE) 50 MCG/ACT nasal spray Place 2 sprays into both nostrils daily. 16 g 6  . ibuprofen (ADVIL,MOTRIN) 200 MG tablet Take 800 mg by mouth every 6 (six) hours as needed.    Marland Kitchen lisinopril (ZESTRIL) 20 MG tablet Take 20 mg by mouth daily.    . Melatonin 3 MG TABS Take 3 mg by mouth at bedtime.    . Multiple Vitamin (MULTIVITAMIN WITH MINERALS) TABS tablet Take 1 tablet by mouth daily.    Marland Kitchen omeprazole (PRILOSEC) 40 MG capsule Take 1 capsule by mouth daily.    . rizatriptan (MAXALT-MLT) 10 MG disintegrating tablet Take 1 tablet (10 mg total) by mouth as needed for migraine. May repeat in 2 hours if needed 20 tablet 0  . [DISCONTINUED] SUMAtriptan (IMITREX) 50 MG tablet Take 50 mg by mouth every 2 (two) hours as needed. Patient states that the physician informed her to stop this medication.     No current facility-administered medications on file prior to visit.    Allergies  Allergen Reactions  . No Known Allergies     Physical exam:  Today's Vitals   01/17/20 0933  BP: (!) 142/94  Pulse: 85  Weight: 233 lb (105.7 kg)  Height: 5\' 2"  (1.575 m)   Body mass index is 42.62 kg/m.   Wt Readings from Last 3 Encounters:  01/17/20  233 lb (105.7 kg)  08/07/18 215 lb (97.5 kg)  11/30/16 217 lb (98.4 kg)     Ht Readings from Last 3 Encounters:  01/17/20 5\' 2"  (1.575 m)  08/07/18 5\' 2"  (1.575 m)  11/30/16 5\' 3"  (1.6 m)      General: The patient is awake, alert and appears not in acute distress. The patient is well groomed. Head: Normocephalic, atraumatic. Neck is supple. Mallampati 3,  neck circumference: 19 inches (!) . Nasal airflow barely patent.  Retrognathia is not there is a small mouth and she can't open wide, scalloped tongue is seen.   Cardiovascular:  Regular rate and cardiac rhythm by pulse,  without distended neck veins. Respiratory: Lungs are clear to auscultation.  Skin:  Without evidence of ankle edema, or rash. Trunk: The patient's posture is erect.   Neurologic exam : The patient is awake and alert, oriented to place and time.   Memory subjective described as intact.  Attention span & concentration ability appears normal.  Speech is fluent,  without  dysarthria, dysphonia or aphasia.  Mood and affect are appropriate.   Cranial nerves: no loss of smell or taste reported  Pupils are equal and briskly reactive to light. Funduscopic exam  reveals pallor in the right optic disc margin, blurred margin, normal vessels. Left mildly blurred disc margins  - she feels more pressure and floaters in the right eye  Extraocular movements in vertical and horizontal planes were intact and without nystagmus. No Diplopia. Visual fields by finger perimetry are intact. Hearing was intact to soft voice and finger rubbing. Facial sensation  intact to fine touch. Facial motor strength is symmetric and tongue and uvula move midline.  Neck ROM : rotation, tilt and flexion extension were normal for age and shoulder shrug was symmetrical.    Motor exam:  Symmetric bulk, tone and ROM.   Normal tone without cog wheeling, symmetric grip strength . Sensory:  Fine touch, pinprick and vibration were normal.  Proprioception  tested in the upper extremities was normal.  Coordination: Rapid alternating movements in the fingers/hands were of normal speed.  The Finger-to-nose maneuver was intact without evidence of ataxia, dysmetria or tremor.  Gait and station: Patient could rise unassisted from a seated position, walked without assistive device.  Stance is of normal width/ base.  Toe and heel walk were deferred.  Deep tendon reflexes: in the  upper and lower extremities are symmetric and intact.  Babinski response was deferred.         After spending a total time of  45  minutes face to face and additional time for physical and neurologic examination, review of laboratory studies,  personal review of imaging studies, reports and results of other testing and review of referral information / records as far as provided in visit, I have established the following assessments:  1)  Benign intracranial hypertension with retro-orbital pressure signs and symptoms.  2)  risk factor is BMI, possible sleep apnea, migraine triptans, and PCOS    My Plan is to proceed with:  1) CT and spinal tap ordered, opening pressure,  Protein, glucose and cells, patient requests sedation.  2) start Topiramate po , 50 mg bid for now.  3) HST  In the next couple of month.  4) PCOS- Dr Harrington Challenger at [facility].   I would like to thank MyLe, OD and [place] Cage, [place],  [place] [address] for allowing me to meet with and to take care of this pleasant patient.   In short, [name] is presenting with " pseudotumor cerebri".   I plan to follow up either personally or through our NP within 1-2 month.   CC: I will share my notes with PCP , OD and Gynecologist   Electronically signed by: Larey Seat, MD 01/17/2020 9:59 AM  [facility] Board certified by The [employer] of Sleep Medicine and Diplomate of the [employer] of Sleep Medicine. Board  certified In Neurology through the [place], Fellow of the [employer] of Neurology. Medical Director of [employer].

## 2020-01-17 NOTE — Patient Instructions (Signed)
Idiopathic Intracranial Hypertension  Idiopathic intracranial hypertension (IIH) is a condition that increases pressure around the brain. The fluid that surrounds the brain and spinal cord (cerebrospinal fluid, CSF) increases and causes the pressure. Idiopathic means that the cause of this condition is not known. IIH affects the brain and spinal cord (is a neurological disorder). If this condition is not treated, it can cause vision loss or blindness. What increases the risk? You are more likely to develop this condition if:  You are severely overweight (obese).  You are a woman who has not gone through menopause.  You take certain medicines, such as birth control or steroids. What are the signs or symptoms? Symptoms of IIH include:  Headaches. This is the most common symptom.  Pain in the shoulders or neck.  Nausea and vomiting.  A "rushing water" or pulsing sound within the ears (pulsatile tinnitus).  Double vision.  Blurred vision.  Brief episodes of complete vision loss. How is this diagnosed? This condition may be diagnosed based on:  Your symptoms.  Your medical history.  CT scan of the brain.  MRI of the brain.  Magnetic resonance venogram (MRV) to check veins in the brain.  Diagnostic lumbar puncture. This is a procedure to remove and examine a sample of cerebrospinal fluid. This procedure can determine whether too much fluid may be causing IIH.  A thorough eye exam to check for swelling or nerve damage in the eyes. How is this treated? Treatment for this condition depends on your symptoms. The goal of treatment is to decrease the pressure around your brain. Common treatments include:  Medicines to decrease the production of spinal fluid and lower the pressure within your skull.  Medicines to prevent or treat headaches.  Surgery to place drains (shunts) in your brain to remove excess fluid.  Lumbar puncture to remove excess cerebrospinal fluid. Follow  these instructions at home:  If you are overweight or obese, work with your health care provider to lose weight.  Take over-the-counter and prescription medicines only as told by your health care provider.  Do not drive or use heavy machinery while taking medicines that can make you sleepy.  Keep all follow-up visits as told by your health care provider. This is important. Contact a health care provider if:  You have changes in your vision, such as: ? Double vision. ? Not being able to see colors (color vision). Get help right away if:  You have any of the following symptoms and they get worse or do not get better. ? Headaches. ? Nausea. ? Vomiting. ? Vision changes or difficulty seeing. Summary  Idiopathic intracranial hypertension (IIH) is a condition that increases pressure around the brain. The cause is not known (is idiopathic).  The most common symptom of IIH is headaches.  Treatment may include medicines or surgery to relieve the pressure on your brain. This information is not intended to replace advice given to you by your health care provider. Make sure you discuss any questions you have with your health care provider. Document Revised: 07/02/2017 Document Reviewed: 06/10/2016 Elsevier Patient Education  2020 Elsevier Inc.  

## 2020-01-22 ENCOUNTER — Telehealth: Payer: Self-pay | Admitting: Neurology

## 2020-01-22 NOTE — Telephone Encounter (Signed)
[employer] auth: NPR spoke to [place] Ref # [id number] patient is scheduled at GI for 01/30/20.

## 2020-01-26 ENCOUNTER — Inpatient Hospital Stay: Admission: RE | Admit: 2020-01-26 | Payer: BLUE CROSS/BLUE SHIELD | Source: Ambulatory Visit

## 2020-01-27 ENCOUNTER — Other Ambulatory Visit: Payer: Self-pay

## 2020-01-27 ENCOUNTER — Telehealth: Payer: Self-pay | Admitting: Nurse Practitioner

## 2020-01-27 ENCOUNTER — Emergency Department (HOSPITAL_COMMUNITY)
Admission: EM | Admit: 2020-01-27 | Discharge: 2020-01-27 | Disposition: A | Discharge status: Home / Self Care | Payer: No Typology Code available for payment source | Attending: Emergency Medicine | Admitting: Emergency Medicine

## 2020-01-27 DIAGNOSIS — R739 Hyperglycemia, unspecified: Secondary | ICD-10-CM | POA: Insufficient documentation

## 2020-01-27 DIAGNOSIS — F17210 Nicotine dependence, cigarettes, uncomplicated: Secondary | ICD-10-CM | POA: Insufficient documentation

## 2020-01-27 DIAGNOSIS — R202 Paresthesia of skin: Secondary | ICD-10-CM | POA: Insufficient documentation

## 2020-01-27 DIAGNOSIS — R42 Dizziness and giddiness: Secondary | ICD-10-CM

## 2020-01-27 DIAGNOSIS — R410 Disorientation, unspecified: Secondary | ICD-10-CM

## 2020-01-27 DIAGNOSIS — G44209 Tension-type headache, unspecified, not intractable: Secondary | ICD-10-CM | POA: Diagnosis not present

## 2020-01-27 DIAGNOSIS — R55 Syncope and collapse: Secondary | ICD-10-CM | POA: Diagnosis not present

## 2020-01-27 DIAGNOSIS — R519 Headache, unspecified: Secondary | ICD-10-CM

## 2020-01-27 DIAGNOSIS — F419 Anxiety disorder, unspecified: Secondary | ICD-10-CM | POA: Diagnosis not present

## 2020-01-27 LAB — COMPREHENSIVE METABOLIC PANEL
ALT: 28 U/L (ref 0–44)
AST: 21 U/L (ref 15–41)
Albumin: 4.1 g/dL (ref 3.5–5.0)
Alkaline Phosphatase: 61 U/L (ref 38–126)
Anion gap: 10 (ref 5–15)
BUN: 9 mg/dL (ref 6–20)
CO2: 20 mmol/L — ABNORMAL LOW (ref 22–32)
Calcium: 8.4 mg/dL — ABNORMAL LOW (ref 8.9–10.3)
Chloride: 110 mmol/L (ref 98–111)
Creatinine, Ser: 0.71 mg/dL (ref 0.44–1.00)
GFR calc Af Amer: >60 mL/min (ref >60)
GFR calc non Af Amer: >60 mL/min (ref >60)
Glucose, Bld: 210 mg/dL — ABNORMAL HIGH (ref 70–99)
Potassium: 3.4 mmol/L — ABNORMAL LOW (ref 3.5–5.1)
Sodium: 140 mmol/L (ref 135–145)
Total Bilirubin: 0.3 mg/dL (ref 0.3–1.2)
Total Protein: 7.1 g/dL (ref 6.5–8.1)

## 2020-01-27 LAB — CBC WITH DIFFERENTIAL/PLATELET
Abs Immature Granulocytes: 0.04 10*3/uL (ref 0.00–0.07)
Basophils Absolute: 0 10*3/uL (ref 0.0–0.1)
Basophils Relative: 0 %
Eosinophils Absolute: 0.2 10*3/uL (ref 0.0–0.5)
Eosinophils Relative: 2 %
HCT: 40.2 % (ref 36.0–46.0)
Hemoglobin: 13.2 g/dL (ref 12.0–15.0)
Immature Granulocytes: 1 %
Lymphocytes Relative: 28 %
Lymphs Abs: 2.1 10*3/uL (ref 0.7–4.0)
MCH: 31.2 pg (ref 26.0–34.0)
MCHC: 32.8 g/dL (ref 30.0–36.0)
MCV: 95 fL (ref 80.0–100.0)
Monocytes Absolute: 0.5 10*3/uL (ref 0.1–1.0)
Monocytes Relative: 6 %
Neutro Abs: 4.8 10*3/uL (ref 1.7–7.7)
Neutrophils Relative %: 63 %
Platelets: 348 10*3/uL (ref 150–400)
RBC: 4.23 MIL/uL (ref 3.87–5.11)
RDW: 12.6 % (ref 11.5–15.5)
WBC: 7.6 10*3/uL (ref 4.0–10.5)
nRBC: 0 % (ref 0.0–0.2)

## 2020-01-27 LAB — I-STAT BETA HCG BLOOD, ED (MC, WL, AP ONLY): I-stat hCG, quantitative: <5 m[IU]/mL (ref <5)

## 2020-01-27 LAB — CBG MONITORING, ED: Glucose-Capillary: 177 mg/dL — ABNORMAL HIGH (ref 70–99)

## 2020-01-27 MED ORDER — METOCLOPRAMIDE HCL 10 MG PO TABS: 10.0000 mg | ORAL_TABLET | Freq: Once | ORAL | Status: DC

## 2020-01-27 MED ORDER — DIPHENHYDRAMINE HCL 25 MG PO CAPS: 25.0000 mg | ORAL_CAPSULE | Freq: Once | ORAL | Status: DC

## 2020-01-27 NOTE — Progress Notes (Signed)
Based on what you shared with me, I feel your condition warrants further evaluation and I recommend that you be seen for a face to face office visit. Your are having neurological symptoms that need to be treated in a face face visit. I do not feel that this should wait until Monday.   NOTE: If you entered your credit card information for this eVisit, you will not be charged. You may see a "hold" on your card for the $35 but that hold will drop off and you will not have a charge processed.   If you are having a true medical emergency please call 911.      For an urgent face to face visit, [facility] has five urgent care centers for your convenience:      NEW:  [facility] at [facility] Directions [phone] [address] [place], [place] [address] . 10 am - 6pm Monday - Friday    [facility]) Get Driving Directions [phone] [address] [place], [place] [address] . 10 am to 8 pm Monday-Friday . 12 pm to 8 pm [facility] at [facility] Get Driving Directions [phone] [address] [place], [place] [address] . 8 am to 8 pm Monday-Friday . 9 am to 6 pm Saturday . 11 am to 6 pm Sunday     [facility] at [facility] Get Driving Directions  [phone] [address] [place], [place] [address] . 8 am to 8 pm Monday-Friday . 8 am to 4 pm [facility] at [facility] Directions [phone] [address] [place], [place] [address] . 12 pm to 6 pm Monday-Friday      Your e-visit answers were reviewed by a board certified advanced clinical practitioner to complete your personal care plan.  Thank you for using e-Visits.

## 2020-01-27 NOTE — ED Notes (Signed)
Pt verbalizes understanding of DC instructions. Pt belongings returned and is ambulatory out of ED.  

## 2020-01-27 NOTE — Discharge Instructions (Signed)
Please follow-up with your neurologist as planned for your continued therapy and evaluation of your headaches.  Please call Monday to discuss the Topamax and whether you need to continue this for now.  Please come back to the emergency department for reevaluation if any new or concerning symptoms.  However your evaluation today was very reassuring.  You have a notable elevation of your blood sugar which improved somewhat on recheck just after drinking some water.  This can be secondary to being dehydrated however you should follow-up with your primary care doctor for evaluation as you are at risk for diabetes.  Please drink plenty of water and eat 3 square meals. As discussed I suspect that this is secondary to be some dehydration/could be related to anxiety/could be some vasovagal syncope/near syncope.  Overall you  have a very reassuring work-up today.

## 2020-01-27 NOTE — ED Triage Notes (Signed)
Patient states Went to neuro and was diagnosed with intracranial htn, supposed to have lumbar puncture yesterday but it was rescheduled to 02/06/20. Patient states she has been having headaches but yesterday in [employer] she became very light headed/dizzy/syncopal. Says her son was standing in front of her and she couldn't figure out why her son was there. Patient states she became very confused and then her body began tingling . Patient said she did video visit with her pcp but they told her to come to ED. Patient reports her head and neck are hurting in triage rated 7/10. Patient says this pain is constant and normal to her.

## 2020-01-27 NOTE — ED Provider Notes (Signed)
[facility]-EMERGENCY DEPT Provider Note   CSN: [id number] Arrival date & time: 01/27/20  1805     History No chief complaint on file.   [name] is a 34 y.o. female.  HPI Patient is a 34 year old female with a past medical history of chronic right-sided migraines, vision changes, macular degeneration, morbid obesity, back pain, and her palpitations, anxiety, depression  Patient is presented today for symptoms that occurred earlier today when she was at [facility].  She states that she had a sudden episode where she felt hot and somewhat lightheaded, states she got tunnel vision and felt anxious and tingling sensation on her face and in her face and bilateral hands.  She states she felt somewhat disoriented and states that she saw weakness her son but states she "did not know why he was there "she states that she was with her husband who is currently at bedside.  He states that she looks somewhat pale and uncomfortable during the episode that she leaned against a wall.  Patient states the episode lasted for approximate 10 minutes and slowly improved.  She states she did not sit down or lay down as there was no way to do this.  She denies any chest pain or shortness of breath during the episode but endorses a headache.  She states the headache was 10/10 which is worse than her chronic typical headache which is 7/10 per patient.  She denies any vomiting but does endorse some mild nausea.  Patient states after this her symptoms seem to resolve.  Patient states that she is recently had her Topamax which she is on long-term increase to 50 mg daily this is started on Wednesday.  She states that she has noted some tingling in her face and hands since that time.  She denies any vision changes although she states that she has chronic vision issues.  She is currently being followed by neurology and being worked up for idiopathic intracranial hypertension.  She has plans for  outpatient LP for diagnosis and therapy of potential IIHP  Patient states that her headache is bandlike but states that her headache is no right-sided.  She states she would prefer to have no treatment at this time as she states the only treatment she has found over the years to work has been going to sleep.    Past Medical History:  Diagnosis Date  . Abnormal Pap smear   . Anxiety   . Depression   . GERD (gastroesophageal reflux disease)    pregnancy related indigestion-tums prn  . Gonorrhea   . Headache(784.0)   . HPV (human papilloma virus) infection    age 20  . Hx of scarlet fever 1996  . Migraines   . PVC (premature ventricular contraction)    history of pvc's-started in early preg 5/13 saw [facility] -told to reduce caffeine intake-pvc's have stopped    Patient Active Problem List   Diagnosis Date Noted  . Vision changes 10/19/2016  . Snoring 10/19/2016  . Sleep related headaches 10/19/2016  . Morbid obesity (HCC) 10/19/2016  . BMI 35.0-35.9,adult 02/17/2013  . Spondylolisthesis at L5-S1 level-grade 1 02/17/2013  . Migraine with aura and with status migrainosus, not intractable 12/16/2012  . Tension headache 12/16/2012  . Round ligament pain 01/11/2012  . Back pain in pregnancy 01/11/2012  . Heart palpitations 12/11/2011    Past Surgical History:  Procedure Laterality Date  . CESAREAN SECTION  2008, 2009  . TUBAL LIGATION    .  WISDOM TOOTH EXTRACTION       OB History    Gravida  3   Para  3   Term  3   Preterm      AB      Living  3     SAB      TAB      Ectopic      Multiple      Live Births  3           Family History  Problem Relation Age of Onset  . Heart disease Mother   . Heart murmur Mother   . Heart disease Father   . Hypertension Father   . Heart attack Father        twice  . Liver cancer Father   . Lung cancer Father        stage 4  . Cancer Father        lung and liver, dx 12/2011  . Asthma Daughter   . Asthma Son    . Anesthesia problems Neg Hx   . Hypotension Neg Hx   . Malignant hyperthermia Neg Hx   . Pseudochol deficiency Neg Hx   . Hearing loss Neg Hx     Social History   Tobacco Use  . Smoking status: Current Every Day Smoker    Packs/day: 0.25    Years: 8.00    Pack years: 2.00    Types: Cigarettes  . Smokeless tobacco: Never Used  . Tobacco comment: 2 cig a day  Vaping Use  . Vaping Use: Never used  Substance Use Topics  . Alcohol use: Yes    Comment: occ  . Drug use: No    Home Medications Prior to Admission medications   Medication Sig Start Date End Date Taking? Authorizing Provider  acetaminophen (TYLENOL) 500 MG tablet Take 1,000 mg by mouth every 6 (six) hours as needed.    [provider]  ALPRAZolam Prudy Feeler) 1 MG tablet Pre procedure anxiolytic for LP. 01/17/20   Dohmeier, Porfirio Mylar, MD  ALTAVERA 0.15-30 MG-MCG tablet Take 1 tablet by mouth every morning. 11/07/15   [provider]  buPROPion (WELLBUTRIN XL) 150 MG 24 hr tablet Take 450 mg by mouth at bedtime.     [provider]  fluticasone (FLONASE) 50 MCG/ACT nasal spray Place 2 sprays into both nostrils daily. 11/02/18   Daphine Deutscher, Mary-Margaret, FNP  ibuprofen (ADVIL,MOTRIN) 200 MG tablet Take 800 mg by mouth every 6 (six) hours as needed.    [provider]  lisinopril (ZESTRIL) 20 MG tablet Take 20 mg by mouth daily.    [provider]  Melatonin 3 MG TABS Take 3 mg by mouth at bedtime.    [provider]  Multiple Vitamin (MULTIVITAMIN WITH MINERALS) TABS tablet Take 1 tablet by mouth daily.    [provider]  omeprazole (PRILOSEC) 40 MG capsule Take 1 capsule by mouth daily.    [provider]  rizatriptan (MAXALT-MLT) 10 MG disintegrating tablet Take 1 tablet (10 mg total) by mouth as needed for migraine. May repeat in 2 hours if needed 08/07/18   Albrizze, Yvonna Alanis E, PA-C  topiramate (TOPAMAX) 25 MG tablet One week 1 tab bid, then 2 bid po. 01/17/20    Dohmeier, Porfirio Mylar, MD  SUMAtriptan (IMITREX) 50 MG tablet Take 50 mg by mouth every 2 (two) hours as needed. Patient states that the physician informed her to stop this medication.  10/11/11  [provider]  Allergies    No known allergies  Review of Systems   Review of Systems  Constitutional: Positive for fatigue. Negative for chills and fever.  HENT: Negative for congestion.   Eyes: Negative for pain.  Respiratory: Negative for cough and shortness of breath.   Cardiovascular: Negative for chest pain and leg swelling.  Gastrointestinal: Positive for nausea. Negative for abdominal pain and vomiting.  Genitourinary: Negative for dysuria.  Musculoskeletal: Negative for myalgias.  Skin: Negative for rash.  Neurological: Positive for headaches. Negative for dizziness.    Physical Exam Updated Vital Signs BP 138/80 (BP Location: Right Arm)   Pulse 90   Temp 97.6 F (36.4 C) (Oral)   Resp 18   Ht 5\' 2"  (1.575 m)   Wt 105.7 kg   LMP  (LMP Unknown)   SpO2 100%   BMI 42.62 kg/m   Physical Exam Vitals and nursing note reviewed.  Constitutional:      General: She is not in acute distress.    Appearance: She is obese.     Comments: Pleasant 34 year old female in no acute distress  HENT:     Head: Normocephalic and atraumatic.     Nose: Nose normal.  Eyes:     General: No scleral icterus. Cardiovascular:     Rate and Rhythm: Normal rate and regular rhythm.     Pulses: Normal pulses.     Heart sounds: Normal heart sounds.  Pulmonary:     Effort: Pulmonary effort is normal. No respiratory distress.     Breath sounds: No wheezing.  Abdominal:     Palpations: Abdomen is soft.     Tenderness: There is no abdominal tenderness.  Musculoskeletal:     Cervical back: Normal range of motion.     Right lower leg: No edema.     Left lower leg: No edema.  Skin:    General: Skin is warm and dry.     Capillary Refill: Capillary refill takes less than 2 seconds.   Neurological:     Mental Status: She is alert. Mental status is at baseline.     Comments: Alert and oriented to self, place, time and event.   Speech is fluent, clear without dysarthria or dysphasia.   Strength 5/5 in upper/lower extremities  Sensation intact in upper/lower extremities   Normal gait.  Negative Romberg. No pronator drift.  Normal finger-to-nose and feet tapping.  CN I not tested  CN II grossly intact visual fields bilaterally. Did not visualize posterior eye.   CN III, IV, VI PERRLA and EOMs intact bilaterally  CN V Intact sensation to sharp and light touch to the face  CN VII facial movements symmetric  CN VIII not tested  CN IX, X no uvula deviation, symmetric rise of soft palate  CN XI 5/5 SCM and trapezius strength bilaterally  CN XII Midline tongue protrusion, symmetric L/R movements   Psychiatric:        Mood and Affect: Mood normal.        Behavior: Behavior normal.     ED Results / Procedures / Treatments   Labs (all labs ordered are listed, but only abnormal results are displayed) Labs Reviewed  COMPREHENSIVE METABOLIC PANEL - Abnormal; Notable for the following components:      Result Value   Potassium 3.4 (*)    CO2 20 (*)    Glucose, Bld 210 (*)    Calcium 8.4 (*)    All other components within normal limits  CBG MONITORING, ED -  Abnormal; Notable for the following components:   Glucose-Capillary 177 (*)    All other components within normal limits  CBC WITH DIFFERENTIAL/PLATELET  I-STAT BETA HCG BLOOD, ED ([facility], [facility], [facility] ONLY)    EKG EKG Interpretation  Date/Time:  Saturday January 27 2020 18:22:26 EDT Ventricular Rate:  84 PR Interval:    QRS Duration: 99 QT Interval:  377 QTC Calculation: 446 R Axis:   -27 Text Interpretation: Sinus rhythm Borderline left axis deviation 12 Lead; Mason-Likar Confirmed by Marianna Fuss ([id number]) on 01/27/2020 7:18:14 PM   Radiology No results found.  Procedures Procedures (including critical care  time)  Medications Ordered in ED Medications - No data to display  ED Course  I have reviewed the triage vital signs and the nursing notes.  Pertinent labs & imaging results that were available during my care of the patient were reviewed by me and considered in my medical decision making (see chart for details).    MDM Rules/Calculators/A&P                          Patient had near syncopal episode today.  She is a 34 year old female with past medical history detailed above.  I discussed this case with my attending physician who cosigned this note including patient's presenting symptoms, physical exam, and planned diagnostics and interventions. Attending physician stated agreement with plan or made changes to plan which were implemented.   Plan is to do near syncope work-up.   EKG without any acute abnormality.  CMP with elevated glucose 210 with no anion gap patient is having abdominal pain and normal respiratory rate.  CBC without leukocytosis or anemia.  Patient has unremarkable work-up I stated she is negative.  She is given some p.o. water which she tolerated well difficulty during ED visit and rechecked blood sugar which was 177.  I discussed with patient the abnormalities on her lab work and she is understanding of need for close follow-up with PCP to recheck her blood sugar and potentially an A1c.   Patient is agreeable to plan to discharge this time.  She is requesting no medication intervention/treatment at this time.  She states that she prefer to be discharged home.  She will follow up with neurology for her continued headaches and for continued work-up and evaluation of potential pseudotumor cerebri.  She will follow up with PCP for recheck of her blood sugar.  She is given return precautions.  We will discharge and discharged in good condition with husband at bedside.  Final Clinical Impression(s) / ED Diagnoses Final diagnoses:  Elevated blood sugar  Near syncope   Tension-type headache, not intractable, unspecified chronicity pattern    Rx / DC Orders ED Discharge Orders    None       Gailen Shelter, [place] 01/27/20 2105    Milagros Loll, MD 01/29/20 [phone]

## 2020-01-30 ENCOUNTER — Ambulatory Visit
Admission: RE | Admit: 2020-01-30 | Discharge: 2020-01-30 | Disposition: A | Discharge status: Home / Self Care | Payer: No Typology Code available for payment source | Source: Ambulatory Visit | Attending: Neurology | Admitting: Neurology

## 2020-01-30 DIAGNOSIS — G932 Benign intracranial hypertension: Secondary | ICD-10-CM

## 2020-01-31 ENCOUNTER — Other Ambulatory Visit: Payer: Self-pay | Admitting: Neurology

## 2020-01-31 ENCOUNTER — Encounter: Payer: Self-pay | Admitting: Neurology

## 2020-01-31 DIAGNOSIS — G932 Benign intracranial hypertension: Secondary | ICD-10-CM

## 2020-01-31 DIAGNOSIS — G43101 Migraine with aura, not intractable, with status migrainosus: Secondary | ICD-10-CM

## 2020-01-31 DIAGNOSIS — E282 Polycystic ovarian syndrome: Secondary | ICD-10-CM

## 2020-01-31 NOTE — Progress Notes (Signed)
IMPRESSION: Normal noncontrast CT appearance of the brain, stable from the 2018 MRI.   Patient can have LP with opening pressure following this CT scan result.

## 2020-02-01 ENCOUNTER — Telehealth: Payer: Self-pay

## 2020-02-01 NOTE — Telephone Encounter (Signed)
Kendra from GI called to discuss the lab orders for pt's LP. One order has oligoclonal bands but they received a second order without oligoclonal bands but something else.  Please call [phone] and ask for Enrique Sack or French Ana.

## 2020-02-01 NOTE — Telephone Encounter (Signed)
Called them back and advised to go with the first order that was placed. When I looked in chart I didn't originally see order so I was entering it in based off notes. I have deleted the order that was placed yesterday.

## 2020-02-06 ENCOUNTER — Ambulatory Visit
Admission: RE | Admit: 2020-02-06 | Discharge: 2020-02-06 | Disposition: A | Discharge status: Home / Self Care | Payer: No Typology Code available for payment source | Source: Ambulatory Visit | Attending: Neurology | Admitting: Neurology

## 2020-02-06 ENCOUNTER — Other Ambulatory Visit: Payer: Self-pay

## 2020-02-06 VITALS — BP 117/51 | HR 79

## 2020-02-06 DIAGNOSIS — G43101 Migraine with aura, not intractable, with status migrainosus: Secondary | ICD-10-CM

## 2020-02-06 DIAGNOSIS — H539 Unspecified visual disturbance: Secondary | ICD-10-CM

## 2020-02-06 DIAGNOSIS — G932 Benign intracranial hypertension: Secondary | ICD-10-CM

## 2020-02-06 DIAGNOSIS — E282 Polycystic ovarian syndrome: Secondary | ICD-10-CM

## 2020-02-06 MED ORDER — LAMOTRIGINE 25 MG PO TABS: 25.0000 mg | ORAL_TABLET | Freq: Two times a day (BID) | ORAL | 0 refills | Status: DC

## 2020-02-06 NOTE — Progress Notes (Signed)
No CSF opening pressure elevation found- Dx is not benign intracranial hypertension at these pressure levels.   Colorless CSF with an opening pressure of 18 cm water. 5 ml of CSF were obtained for laboratory studies. The patient tolerated the procedure well and there were no apparent complications.  Cc to [employer]

## 2020-02-06 NOTE — Progress Notes (Signed)
LP serum labs drawn from pt's L AC. Pt tolerated procedure well. Site is unremarkable.

## 2020-02-06 NOTE — Discharge Instructions (Signed)

## 2020-02-06 NOTE — Progress Notes (Signed)
Elevated CSF glucose level and normal protein- no WBC count, no evidence of infection-  This is most likely a glucose elevation from blood level transfer to CSF.  Treatment would include blood glucose reduction, weight loss and Topiramate helps with Headaches.   Please share with [facility].

## 2020-02-07 ENCOUNTER — Other Ambulatory Visit: Payer: Self-pay | Admitting: Neurology

## 2020-02-07 ENCOUNTER — Telehealth: Payer: Self-pay | Admitting: Neurology

## 2020-02-07 DIAGNOSIS — G932 Benign intracranial hypertension: Secondary | ICD-10-CM

## 2020-02-07 DIAGNOSIS — G971 Other reaction to spinal and lumbar puncture: Secondary | ICD-10-CM

## 2020-02-07 DIAGNOSIS — G43101 Migraine with aura, not intractable, with status migrainosus: Secondary | ICD-10-CM

## 2020-02-07 MED ORDER — LAMOTRIGINE 25 MG PO TABS: 25.0000 mg | ORAL_TABLET | Freq: Two times a day (BID) | ORAL | 0 refills | Status: DC

## 2020-02-07 NOTE — Progress Notes (Signed)
4 red blood cells are normal, no WBC are noted, so this is likely related to a small bleed at the tip of the needle.

## 2020-02-07 NOTE — Telephone Encounter (Signed)
I reconciled medications. CD

## 2020-02-08 ENCOUNTER — Other Ambulatory Visit: Payer: Self-pay | Admitting: Neurology

## 2020-02-08 ENCOUNTER — Other Ambulatory Visit: Payer: Self-pay

## 2020-02-08 ENCOUNTER — Ambulatory Visit
Admission: RE | Admit: 2020-02-08 | Discharge: 2020-02-08 | Disposition: A | Discharge status: Home / Self Care | Payer: No Typology Code available for payment source | Source: Ambulatory Visit | Attending: Neurology | Admitting: Neurology

## 2020-02-08 DIAGNOSIS — G971 Other reaction to spinal and lumbar puncture: Secondary | ICD-10-CM

## 2020-02-08 MED ORDER — IOPAMIDOL (ISOVUE-M 200) INJECTION 41%
1.0000 mL | Freq: Once | INTRAMUSCULAR | Status: AC
  Administered 2020-02-08: 1 mL via EPIDURAL

## 2020-02-08 NOTE — Discharge Instructions (Signed)

## 2020-02-10 LAB — CSF CULTURE W GRAM STAIN
MICRO NUMBER:: 10669773
Result:: NO GROWTH
SPECIMEN QUALITY:: ADEQUATE

## 2020-02-10 LAB — GLUCOSE, CSF: Glucose, CSF: 86 mg/dL — ABNORMAL HIGH (ref 40–80)

## 2020-02-10 LAB — CSF CELL COUNT WITH DIFFERENTIAL
RBC Count, CSF: 4 cells/uL — ABNORMAL HIGH
WBC, CSF: 0 cells/uL (ref 0–5)

## 2020-02-10 LAB — PROTEIN, CSF: Total Protein, CSF: 27 mg/dL (ref 15–45)

## 2020-02-10 LAB — OLIGOCLONAL BANDS, CSF + SERM

## 2020-02-13 ENCOUNTER — Other Ambulatory Visit: Payer: Self-pay | Admitting: Neurology

## 2020-02-27 ENCOUNTER — Ambulatory Visit: Payer: Self-pay | Admitting: Neurology

## 2020-03-04 ENCOUNTER — Telehealth: Payer: Self-pay | Admitting: Neurology

## 2020-03-04 ENCOUNTER — Ambulatory Visit: Payer: BLUE CROSS/BLUE SHIELD | Admitting: Neurology

## 2020-03-04 ENCOUNTER — Encounter: Payer: Self-pay | Admitting: Neurology

## 2020-03-04 VITALS — BP 148/91 | HR 98 | Ht 62.0 in | Wt 232.0 lb

## 2020-03-04 DIAGNOSIS — H5711 Ocular pain, right eye: Secondary | ICD-10-CM

## 2020-03-04 DIAGNOSIS — G44019 Episodic cluster headache, not intractable: Secondary | ICD-10-CM

## 2020-03-04 DIAGNOSIS — E282 Polycystic ovarian syndrome: Secondary | ICD-10-CM | POA: Diagnosis not present

## 2020-03-04 DIAGNOSIS — G932 Benign intracranial hypertension: Secondary | ICD-10-CM | POA: Diagnosis not present

## 2020-03-04 DIAGNOSIS — G43101 Migraine with aura, not intractable, with status migrainosus: Secondary | ICD-10-CM | POA: Diagnosis not present

## 2020-03-04 MED ORDER — LAMOTRIGINE 100 MG PO TABS: 100.0000 mg | ORAL_TABLET | Freq: Two times a day (BID) | ORAL | 5 refills | Status: DC

## 2020-03-04 MED ORDER — ACETAZOLAMIDE ER 500 MG PO CP12: 500.0000 mg | ORAL_CAPSULE | Freq: Two times a day (BID) | ORAL | 5 refills | Status: DC

## 2020-03-04 NOTE — Addendum Note (Signed)
Addended by: Melvyn Novas on: 03/04/2020 08:54 AM   Modules accepted: Orders

## 2020-03-04 NOTE — Telephone Encounter (Signed)
[employer] Auth: [id number] (exp. 03/04/20 to 08/30/20)   Patient has [facility] faxed order to [facility]. Ph # [phone] & fax # [phone]. Anywhere else would be out of network.

## 2020-03-04 NOTE — Progress Notes (Signed)
SLEEP MEDICINE CLINIC    Provider:  Melvyn Novas, MD  Primary Care Physician:  [facility] [address] [place] [address]     Referring Provider: Maryfrances Bunnell [address] [place],  [place] [address]          Chief Complaint according to patient   Patient presents with:    . New Patient (Initial Visit)     presents today 6/5 she developed floaters in the rt eye. went to  Optometrist and eyes were dilated - there was swelling in optic area. advised her to be seen here to address treatment. she still has the floaters , retro-orbital pressure headaches and pain with eye movement. Nausea.       HISTORY OF PRESENT ILLNESS:  [name] is a 34 y.o. year old Caucasian female patient and seen upon OD  referral  from [facility], on 03/04/2020 .  I have the pleasure of meeting [name] today in a revisit.  The patient had undergone a CT head without contrast to make sure that she is safe to undergo a fluoroscopic spinal fluid evaluation.  She had a normal noncontrast CT of the brain which was compared to an image from 2018 and unchanged.  The patient's.  Take spinal tap to place on 6 July, her opening pressure was 18 cmH2O 5 mL of CFS were obtained for laboratory studies she did develop a post spinal tap headache and underwent a blood patch 2 days later.  The opening pressure was not elevated and is actually quite normal for her body mass index.  She had complained about topiramate not helping her headaches and on the contrary causing her lots of tingling and numbness around the mouth fingers and toes and since this that lasted well over 3 months he discontinued topiramate and we switched to Lamictal.  Her headaches also came with a sensation of nausea and sometimes vertigo sensations.  She stated that after the spinal tap however she felt better in terms of her regular headaches but they have slowly crept up again.   Lamictal has been now reaching a dose of 50 mg twice daily and seems to not interfere with her headaches very much.  There is a question if she may have still pseudotumor or benign intracranial hypertension and if we should put her on a low-dose of Diamox.    I also have to advise her that oral birth control pills may not work well when topiramate Lamictal or other enzyme inducers are taken at the same time and that I recommend a barrier contraception.    Chief concern according to patient :   [name] is a 34 year old right- handed Caucasian female with a new onset headache that is described as a retro-orbital pressure.  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  She is not sure what led to the new symptom onset.  She does have several risk factors including a BMI of 42.6, she has elevated blood pressure at baseline she is a smoker, she was not on any tetracycline antibiotics, and not on Retin-A.  She remains with impaired vision.  No photophobia presently.   She  has a past medical history of Abnormal Pap smear, Anxiety, Depression, GERD (gastroesophageal reflux disease), , Intracranial  pressure Headache(784.0),  Polycystic ovarian syndrome with hirsutism, irregular menses, HPV (human papilloma virus) infection, scarlet fever (1996), Migraines, and PVC (premature ventricular contraction). She is aloud snorer.  A sleep evalution will follow later.  1)  Benign intracranial hypertension with retro-orbital pressure signs and symptoms.  2)  risk factor is BMI, possible sleep apnea, migraine triptans, and PCOS    My Plan is to proceed with: 1) CT and spinal tap ordered, opening pressure,  Protein, glucose and cells, patient requests sedation.  2) start Topiramate po , 50 mg bid for now.  3) HST  In the next couple of month.   4) PCOS- Dr Tenny Craw at [facility].        Family medical /sleep history: maternal family with HA, migraines.    Social history:  Patient is working as a Engineer, civil (consulting) and lives in a household with 5 persons. Family status is married , with 3 children.  The patient currently works/ day time. Pets are present, cats and dogs. Tobacco use- cig. 2 a day.   ETOH use - seldomly , Caffeine intake in form of Coffee( 1 cup a day ) Soda( 2 a day ) - no energy drinks. Regular exercise- none .       Sleep habits are as follows: The patient's dinner time is between 6-7 PM. The patient goes to bed at 10 PM and continues to sleep for 4-5 hours, wakes for 1 bathroom break.   The preferred sleep position is laterally, with the support of 3 pillows. Dreams are reportedly frequent/vivid.  6.30 AM is the usual rise time. The patient wakes up spontaneously  She reports not feeling refreshed or restored in AM, with symptoms such as dry mouth, morning headaches and sometimes she is woken by headaches. ,  Review of Systems: Out of a complete 14 system review, the patient complains of only the following symptoms, and all other reviewed systems are negative.:  Fatigue, sleepiness , snoring, headaches in sleep and after sleep. Pressure retro-orbitally.    How likely are you to doze in the following situations: 0 = not likely, 1 = slight chance, 2 = moderate chance, 3 = high chance    Total = 11/ 24 points   Social History   Socioeconomic History  . Marital status: Married    Spouse name: Not on file  . Number of children: 2  . Years of education: Not on file  . Highest education level: Not on file  Occupational History  . Occupation: stay at home mother  Tobacco Use  . Smoking status: Current Every Day Smoker    Packs/day: 0.25    Years: 8.00    Pack years: 2.00    Types: Cigarettes  . Smokeless tobacco: Never Used  . Tobacco comment: 2 cig a day  Vaping Use  . Vaping Use: Never used  Substance and  Sexual Activity  . Alcohol use: Yes    Comment: occ  . Drug use: No  . Sexual activity: Yes    Birth control/protection: Surgical  Other Topics Concern  . Not on file  Social History Narrative  . Not on file   Social Determinants of Health   Financial Resource Strain:   . Difficulty of Paying Living Expenses:   Food Insecurity:   . Worried About Programme researcher, broadcasting/film/video in the Last Year:   . Barista in the Last Year:   Transportation Needs:   . Freight forwarder (Medical):   Joanna Kitchen  Lack of Transportation (Non-Medical):   Physical Activity:   . Days of Exercise per Week:   . Minutes of Exercise per Session:   Stress:   . Feeling of Stress :   Social Connections:   . Frequency of Communication with Friends and Family:   . Frequency of Social Gatherings with Friends and Family:   . Attends Religious Services:   . Active Member of Clubs or Organizations:   . Attends BankerClub or Organization Meetings:   Joanna Kitchen. Marital Status:     Family History  Problem Relation Age of Onset  . Heart disease Mother   . Heart murmur Mother   . Heart disease Father   . Hypertension Father   . Heart attack Father        twice  . Liver cancer Father   . Lung cancer Father        stage 4  . Cancer Father        lung and liver, dx 12/2011  . Asthma Daughter   . Asthma Son   . Anesthesia problems Neg Hx   . Hypotension Neg Hx   . Malignant hyperthermia Neg Hx   . Pseudochol deficiency Neg Hx   . Hearing loss Neg Hx     Past Medical History:  Diagnosis Date  . Abnormal Pap smear   . Anxiety   . Depression   . GERD (gastroesophageal reflux disease)    pregnancy related indigestion-tums prn  . Gonorrhea   . Headache(784.0)   . HPV (human papilloma virus) infection    age 34  . Hx of scarlet fever 1996  . Migraines   . PVC (premature ventricular contraction)    history of pvc's-started in early preg 5/13 saw [place] -told to reduce caffeine intake-pvc's have stopped    Past  Surgical History:  Procedure Laterality Date  . CESAREAN SECTION  2008, 2009  . TUBAL LIGATION    . WISDOM TOOTH EXTRACTION       Current Outpatient Medications on File Prior to Visit  Medication Sig Dispense Refill  . acetaminophen (TYLENOL) 500 MG tablet Take 1,000 mg by mouth every 6 (six) hours as needed.    . ALPRAZolam (XANAX) 1 MG tablet Pre procedure anxiolytic for LP. 2 tablet 0  . ALTAVERA 0.15-30 MG-MCG tablet Take 1 tablet by mouth every morning.  4  . buPROPion (WELLBUTRIN XL) 150 MG 24 hr tablet Take 450 mg by mouth at bedtime.     . fluticasone (FLONASE) 50 MCG/ACT nasal spray Place 2 sprays into both nostrils daily. 16 g 6  . ibuprofen (ADVIL,MOTRIN) 200 MG tablet Take 800 mg by mouth every 6 (six) hours as needed.    . lamoTRIgine (LAMICTAL) 25 MG tablet Take 1 tablet (25 mg total) by mouth 2 (two) times daily. (Patient taking differently: Take 50 mg by mouth 2 (two) times daily. ) 60 tablet 0  . lisinopril (ZESTRIL) 20 MG tablet Take 20 mg by mouth daily.    . Melatonin 3 MG TABS Take 3 mg by mouth at bedtime.    . Multiple Vitamin (MULTIVITAMIN WITH MINERALS) TABS tablet Take 1 tablet by mouth daily.    Joanna Kitchen. omeprazole (PRILOSEC) 40 MG capsule Take 1 capsule by mouth daily.    . rizatriptan (MAXALT-MLT) 10 MG disintegrating tablet Take 1 tablet (10 mg total) by mouth as needed for migraine. May repeat in 2 hours if needed 20 tablet 0  . [DISCONTINUED] SUMAtriptan (IMITREX) 50 MG tablet Take 50  mg by mouth every 2 (two) hours as needed. Patient states that the physician informed her to stop this medication.     No current facility-administered medications on file prior to visit.    Allergies  Allergen Reactions  . No Known Allergies     Physical exam:  Today's Vitals   03/04/20 0822  BP: (!) 148/91  Pulse: 98  Weight: (!) 232 lb (105.2 kg)  Height: 5\' 2"  (1.575 m)   Body mass index is 42.43 kg/m.   Wt Readings from Last 3 Encounters:  03/04/20 (!) 232 lb  (105.2 kg)  01/27/20 233 lb (105.7 kg)  01/17/20 233 lb (105.7 kg)     Ht Readings from Last 3 Encounters:  03/04/20 5\' 2"  (1.575 m)  01/27/20 5\' 2"  (1.575 m)  01/17/20 5\' 2"  (1.575 m)      General: The patient is awake, alert and appears not in acute distress. The patient is well groomed. Head: Normocephalic, atraumatic. Neck is supple. Mallampati 3,  neck circumference: 19 inches (!) . Nasal airflow barely patent.  Retrognathia is not there is a small mouth and she can't open wide, scalloped tongue is seen.   Cardiovascular:  Regular rate and cardiac rhythm by pulse,  without distended neck veins. Respiratory: Lungs are clear to auscultation.  Skin:  Without evidence of ankle edema, or rash. Trunk: The patient's posture is erect.   Neurologic exam : The patient is awake and alert, oriented to place and time.   Memory subjective described as intact.  Attention span & concentration ability appears normal.  Speech is fluent,  without  dysarthria, dysphonia or aphasia.  Mood and affect are appropriate.   Cranial nerves: no loss of smell or taste reported  Pupils are equal and briskly reactive to light. Funduscopic exam  reveals pallor in the right optic disc margin, blurred margin, normal vessels.  Left mildly blurred disc margins  - she feels more pressure and floaters in the right eye - she  Has been keeping a journal and the floaters have improved after LP.  Eye pain when directing gaze to the right.  Extraocular movements in vertical and horizontal planes were intact and without nystagmus. No Diplopia. Visual fields by finger perimetry are intact. Hearing was intact to soft voice and finger rubbing. Facial sensation intact to fine touch. Facial motor strength is symmetric and tongue and uvula move midline.  Neck ROM : rotation, tilt and flexion extension were normal for age and shoulder shrug was symmetrical.    Motor exam:  Symmetric bulk, tone and ROM.   Normal tone without  cog wheeling, symmetric grip strength . Sensory:  Fine touch,and vibration are felt normal since TPM was discontinued. .  Proprioception tested in the upper extremities was normal.  Coordination: Rapid alternating movements in the fingers/hands were of normal speed.  The Finger-to-nose maneuver was intact without evidence of ataxia, dysmetria or tremor.  Gait and station: Patient could rise unassisted from a seated position, walked without assistive device.  Stance is of normal width/ base.  Toe and heel walk were deferred.  Deep tendon reflexes: in the  upper and lower extremities are symmetric and intact.  Babinski response was deferred.         After spending a total time of  45  minutes face to face and additional time for physical and neurologic examination, review of laboratory studies,  personal review of imaging studies, reports and results of other testing and review of referral information /  records as far as provided in visit, I have established the following assessments:  Assessement )  1)Still possible  Benign intracranial hypertension- I will add diamox. 2)rule out optic neuritis- MRI brain with contrast needed to rule out MS.   I would like to thank MyLe, OD and Paschal Dopp, Pa [address] [place],  [place] [address] for allowing me to meet with and to take care of this pleasant patient.   In short, NAKYIAH [place] is presenting with residual HA and possible optic neuritis findings. MRI is needed. Opening pressure at 18 cm water was not very high, rather normal for her BMI>   Still, will add diamox in AM and Pm , keep on maxalt, on lamictal. > Sleep evaluation is at this time urgent- morning headaches and cluster headaches described.    I plan to follow up either personally or through our NP within 1-2 month.    Electronically signed by: Melvyn Novas, MD 03/04/2020 8:35 AM  [facility] Board certified by The  [employer] of Sleep Medicine and Diplomate of the [employer] of Sleep Medicine. Board certified In Neurology through the ABPN, Fellow of the [employer] of Neurology. Medical Director of [facility].

## 2020-03-12 ENCOUNTER — Institutional Professional Consult (permissible substitution): Payer: Self-pay | Admitting: Neurology

## 2020-03-14 ENCOUNTER — Telehealth: Payer: Self-pay | Admitting: Neurology

## 2020-03-14 NOTE — Telephone Encounter (Signed)
Patient is scheduled at [facility] location for Friday 03/22/20.

## 2020-03-17 ENCOUNTER — Other Ambulatory Visit: Payer: BLUE CROSS/BLUE SHIELD

## 2020-03-18 NOTE — Telephone Encounter (Signed)
[facility] reached out to me and informed me since you have [facility] that is out of network with them.. I did fax the MRI order to [facility] did you get a call from [facility]?

## 2020-03-25 ENCOUNTER — Telehealth: Payer: Self-pay | Admitting: Neurology

## 2020-03-25 ENCOUNTER — Encounter: Payer: Self-pay | Admitting: Neurology

## 2020-03-25 NOTE — Telephone Encounter (Signed)
Received a report from [facility] that MRI was completed of the brain. Will have Dr Dohmeier review and call patient with the results once she has reviewed the report.

## 2020-03-25 NOTE — Telephone Encounter (Signed)
Normal MRA neck and brain- no stenosis.

## 2020-04-19 ENCOUNTER — Telehealth: Payer: BLUE CROSS/BLUE SHIELD | Admitting: Emergency Medicine

## 2020-04-19 DIAGNOSIS — L03314 Cellulitis of groin: Secondary | ICD-10-CM

## 2020-04-19 MED ORDER — DOXYCYCLINE HYCLATE 100 MG PO CAPS: 100.0000 mg | ORAL_CAPSULE | Freq: Two times a day (BID) | ORAL | 0 refills | Status: DC

## 2020-04-19 NOTE — Progress Notes (Signed)
E Visit for Cellulitis  We are sorry that you are not feeling well. Here is how we plan to help!  Based on what you shared with me it looks like you have cellulitis.  Cellulitis looks like areas of skin redness, swelling, and warmth; it develops as a result of bacteria entering under the skin. Little red spots and/or bleeding can be seen in skin, and tiny surface sacs containing fluid can occur. Fever can be present. Cellulitis is almost always on one side of a body, and the lower limbs are the most common site of involvement.   I have prescribed:  Doxycycline 100mg  twice a day for 7 days  OF NOTE- "TINY FLUID FILLED SACKS," AS YOU DESCRIBE IT, IS CONCERNING FOR A HERPES VIRUS INFECTION. AS I CANNOT SEE THE AREA YOU ARE DESCRIBING, I CAN ONLY GO OFF OF YOUR DESCRIPTION. IF THIS IS A HERPES VIRUS INFECTION, WHICH CAN CAUSE FEVER,SWELLING, AND PAIN, THE ANTIBIOTIC WILL NOT HELP. IF YOU HAVE ANY CONCERN THAT THIS COULD BE A HERPES VIRUS INFECTION YOU SHOULD BE SEEN IN A FACE TO FACE VISIT AT AN URGENT CARE OR YOUR PRIMARY CARE OFFICE.  HOME CARE:  . Take your medications as ordered and take all of them, even if the skin irritation appears to be healing.   GET HELP RIGHT AWAY IF:  . Symptoms that don't begin to go away within 48 hours. . Severe redness persists or worsens . If the area turns color, spreads or swells. . If it blisters and opens, develops yellow-brown crust or bleeds. . You develop a fever or chills. . If the pain increases or becomes unbearable.  . Are unable to keep fluids and food down.  MAKE SURE YOU    Understand these instructions.  Will watch your condition.  Will get help right away if you are not doing well or get worse.  Thank you for choosing an e-visit. Your e-visit answers were reviewed by a board certified advanced clinical practitioner to complete your personal care plan. Depending upon the condition, your plan could have included both over the counter or  prescription medications. Please review your pharmacy choice. Make sure the pharmacy is open so you can pick up prescription now. If there is a problem, you may contact your provider through and have the prescription routed to another pharmacy. Your safety is important to [employer]. If you have drug allergies check your prescription carefully.  For the next 24 hours you can use MyChart to ask questions about today's visit, request a non-urgent call back, or ask for a work or school excuse. You will get an email in the next two days asking about your experience. I hope that your e-visit has been valuable and will speed your recovery.  **Please do not respond to this message unless you have follow up questions.** Greater than 5 but less than 10 minutes spent researching, coordinating, and implementing care for this patient today

## 2020-04-23 ENCOUNTER — Ambulatory Visit: Payer: BLUE CROSS/BLUE SHIELD | Admitting: Neurology

## 2020-06-04 ENCOUNTER — Encounter: Payer: Self-pay | Admitting: Neurology

## 2020-06-06 ENCOUNTER — Encounter: Payer: Self-pay | Admitting: Neurology

## 2020-06-06 ENCOUNTER — Ambulatory Visit: Payer: Self-pay | Admitting: Neurology

## 2020-06-06 VITALS — BP 124/78 | HR 84 | Ht 62.0 in | Wt 230.0 lb

## 2020-06-06 DIAGNOSIS — T50905S Adverse effect of unspecified drugs, medicaments and biological substances, sequela: Secondary | ICD-10-CM | POA: Insufficient documentation

## 2020-06-06 DIAGNOSIS — E282 Polycystic ovarian syndrome: Secondary | ICD-10-CM

## 2020-06-06 DIAGNOSIS — G932 Benign intracranial hypertension: Secondary | ICD-10-CM

## 2020-06-06 MED ORDER — ACETAZOLAMIDE ER 500 MG PO CP12: 500.0000 mg | ORAL_CAPSULE | Freq: Every day | ORAL | 5 refills | Status: DC

## 2020-06-06 NOTE — Patient Instructions (Signed)
Diet for Polycystic Ovary Syndrome Polycystic ovary syndrome (PCOS) is a disorder of the chemicals (hormones) that regulate a woman's reproductive system, including monthly periods (menstruation). The condition causes important hormones to be out of balance. PCOS can:  Stop your periods or make them irregular.  Cause cysts to develop on your ovaries.  Make it difficult to get pregnant.  Stop your body from responding to the effects of insulin (insulin resistance). Insulin resistance can lead to obesity and diabetes. Changing what you eat can help you manage PCOS and improve your health. Following a balanced diet can help you lose weight and improve the way that your body uses insulin. What are tips for following this plan?  Follow a balanced diet for meals and snacks. Eat breakfast, lunch, dinner, and one or two snacks every day.  Include protein in each meal and snack.  Choose whole grains instead of products that are made with refined flour.  Eat a variety of foods.  Exercise regularly as told by your health care provider. Aim to do 30 or more minutes of exercise on most days of the week.  If you are overweight or obese: ? Pay attention to how many calories you eat. Cutting down on calories can help you lose weight. ? Work with your health care provider or a diet and nutrition specialist (dietitian) to figure out how many calories you need each day. What foods can I eat?  Fruits Include a variety of colors and types. All fruits are helpful for PCOS. Vegetables Include a variety of colors and types. All vegetables are helpful for PCOS. Grains Whole grains, such as whole wheat. Whole-grain breads, crackers, cereals, and pasta. Unsweetened oatmeal, bulgur, barley, quinoa, and brown rice. Tortillas made from corn or whole-wheat flour. Meats and other proteins Low-fat (lean) proteins, such as fish, chicken, beans, eggs, and tofu. Dairy Low-fat dairy products, such as skim milk,  cheese sticks, and yogurt. Beverages Low-fat or fat-free drinks, such as water, low-fat milk, sugar-free drinks, and small amounts of 100% fruit juice. Seasonings and condiments Ketchup. Mustard. Barbecue sauce. Relish. Low-fat or fat-free mayonnaise. Fats and oils Olive oil or canola oil. Walnuts and almonds. The items listed above may not be a complete list of recommended foods and beverages. Contact a dietitian for more options. What foods are not recommended? Foods that are high in calories or fat. Fried foods. Sweets. Products that are made from refined white flour, including white bread, pastries, white rice, and pasta. The items listed above may not be a complete list of foods and beverages to avoid. Contact a dietitian for more information. Summary  PCOS is a hormonal imbalance that affects a woman's reproductive system.  You can help to manage your PCOS by exercising regularly and eating a healthy, varied diet of vegetables, fruit, whole grains, low-fat (lean) protein, and low-fat dairy products.  Changing what you eat can improve the way that your body uses insulin, help your hormones reach normal levels, and help you lose weight. This information is not intended to replace advice given to you by your health care provider. Make sure you discuss any questions you have with your health care provider. Document Revised: 11/09/2018 Document Reviewed: 05/24/2017 [employer] Patient Education  2020 [employer] Polycystic Ovarian Syndrome  Polycystic ovarian syndrome (PCOS) is a common hormonal disorder among women of reproductive age. In most women with PCOS, many small fluid-filled sacs (cysts) grow on the ovaries, and the cysts are not part of a normal menstrual cycle.   PCOS can cause problems with your menstrual periods and make it difficult to get pregnant. It can also cause an increased risk of miscarriage with pregnancy. If it is not treated, PCOS can lead to serious health problems,  such as diabetes and heart disease. What are the causes? The cause of PCOS is not known, but it may be the result of a combination of certain factors, such as:  Irregular menstrual cycle.  High levels of certain hormones (androgens).  Problems with the hormone that helps to control blood sugar (insulin resistance).  Certain genes. What increases the risk? This condition is more likely to develop in women who have a family history of PCOS. What are the signs or symptoms? Symptoms of PCOS may include:  Multiple ovarian cysts.  Infrequent periods or no periods.  Periods that are too frequent or too heavy.  Unpredictable periods.  Inability to get pregnant (infertility) because of not ovulating.  Increased growth of hair on the face, chest, stomach, back, thumbs, thighs, or toes.  Acne or oily skin. Acne may develop during adulthood, and it may not respond to treatment.  Pelvic pain.  Weight gain or obesity.  Patches of thickened and dark brown or black skin on the neck, arms, breasts, or thighs (acanthosis nigricans).  Excess hair growth on the face, chest, abdomen, or upper thighs (hirsutism). How is this diagnosed? This condition is diagnosed based on:  Your medical history.  A physical exam, including a pelvic exam. Your health care provider may look for areas of increased hair growth on your skin.  Tests, such as: ? Ultrasound. This may be used to examine the ovaries and the lining of the uterus (endometrium) for cysts. ? Blood tests. These may be used to check levels of sugar (glucose), female hormone (testosterone), and female hormones (estrogen and progesterone) in your blood. How is this treated? There is no cure for PCOS, but treatment can help to manage symptoms and prevent more health problems from developing. Treatment varies depending on:  Your symptoms.  Whether you want to have a baby or whether you need birth control (contraception). Treatment may  include nutrition and lifestyle changes along with:  Progesterone hormone to start a menstrual period.  Birth control pills to help you have regular menstrual periods.  Medicines to make you ovulate, if you want to get pregnant.  Medicine to reduce excessive hair growth.  Surgery, in severe cases. This may involve making small holes in one or both of your ovaries. This decreases the amount of testosterone that your body produces. Follow these instructions at home:  Take over-the-counter and prescription medicines only as told by your health care provider.  Follow a healthy meal plan. This can help you reduce the effects of PCOS. ? Eat a healthy diet that includes lean proteins, complex carbohydrates, fresh fruits and vegetables, low-fat dairy products, and healthy fats. Make sure to eat enough fiber.  If you are overweight, lose weight as told by your health care provider. ? Losing 10% of your body weight may improve symptoms. ? Your health care provider can determine how much weight loss is best for you and can help you lose weight safely.  Keep all follow-up visits as told by your health care provider. This is important. Contact a health care provider if:  Your symptoms do not get better with medicine.  You develop new symptoms. This information is not intended to replace advice given to you by your health care provider. Make sure you   discuss any questions you have with your health care provider. Document Revised: 07/02/2017 Document Reviewed: 01/05/2016 [employer] Patient Education  2020 [employer]  

## 2020-06-06 NOTE — Progress Notes (Signed)
SLEEP MEDICINE CLINIC    Provider:  Melvyn Novas, MD  Primary Care Physician:  [facility] @ [facility] [address] [place] [address]     Referring Provider:           Chief Complaint according to patient   Patient presents with:    . New Patient (Initial Visit)     pt alone, rm 10. present today. she was suppose to follow up post MRI/LP but was unable to come due to insurance stopped covering for our location. post LP she had relief with her headahces was changed to diamox because topamax SE,  she felt this helped initially.  new concern for dizziness started up 1.5 mth ago. she states that its with position changes, she has vision concerns in peripheral vision when the dizziness happens. this will last 2-3 seconds. this has cause increase in headaches and she wakes up with headaches and neck stiffness.  She presents with vertigo, nausea and orthostasis. Still smoking.       HISTORY OF PRESENT ILLNESS:  06/06/2020 . [name] is a 34 y.o. 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  Today's main concern is dizziness.  This started about 6 weeks ago and she feels that position changes trigger the onset she also has problems seeing her peripheral visual field of the dizziness that is on lasting about 2 to 3 seconds.  She feels her headaches again are more common on frequent and often she will actually wakes up with him and his neck stiffness. My nurse today performed the orthostatic blood pressure measurements in supine lying position the patient had 132/83  mmHg blood pressure and a heart rate regular rate 83 bpm while seated her blood pressure decreased to 126/77 mmHg her heart rate stayed stable at 83 bpm regular when standing her blood pressure very little decreased 224/78 mmHg and the heart rate became 84 there was no compensation for the positional changes as you would expect.  There was also no significant drop in systolic blood pressure.  The patient had undergone in July August a complete work-up for pseudotumor cerebri first a CT of the head without contrast was obtained to make sure that we did not deal with any space-occupying mass.  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  There was no abnormal vascular hyperintensity normal sinuses no abnormal contrast 20 appearance of the brain in comparison to a 2018 study no changes.  She was deemed safe to undergo a spinal tap she was prepped with Betadine and 1% lidocaine was used as local anesthesia with anesthetic.  Lumbar puncture was performed between L3 and L4 with a return of clear colorless CSF opening pressure of 18 cmH2O 5 mL were taken for laboratory testing.  This was not a elevated CSF pressure at all, and she was at the time on TPM- later d/c and changed to  acetazolamide Diamox 500 mg twice daily.  she sometimes takes Tylenol for pain, she was given Xanax for  the procedure but not for daily use.  She also reports that she is not sleepy in daytime her Epworth score is 4 her fatigue severity score was not filled.  So we try to figure out why she has vertigo.  She reports a spinning sensation when she gets up quickly, it is she who is in motion. She feels the peripheral vision is decreased, " getting dark". The worst is when she bends over or is squatting. No changes in colour perception noted. Sneezing and coughing do not cay use vertigo but make the head pain worse.  She has increased hydration and this has not changed  her vertigo- but reduced the tingling from diamox.            She was  seen originally upon OD referral  from [facility]/ [facility], on 09-02-2016.  I have the pleasure of meeting [name] today in a revisit.  The patient had undergone a CT head without contrast to make sure that she is safe to undergo a fluoroscopic spinal fluid evaluation.  She had a normal noncontrast CT of the brain which was compared to an image from 2018 and unchanged.  The patient's.  Take spinal tap to place on 6 July, her opening pressure was 18 cmH2O 5 mL of CFS were obtained for laboratory studies she did develop a post spinal tap headache and underwent a blood patch 2 days later.   The opening pressure was not elevated and is actually quite normal for her body mass index.  She had complained about topiramate not helping her headaches and on the contrary causing her lots of tingling and numbness around the mouth fingers and toes and since this that lasted well over 3 months he discontinued topiramate and we switched to Lamictal.  Her headaches also came with a sensation of nausea and sometimes vertigo sensations.  She stated that after the spinal tap however she felt better in terms of her regular headaches but they have slowly crept up again.  Lamictal has been now reaching a dose of 50 mg twice daily and seems to not interfere with her headaches very much.  There is a question if she may have still pseudotumor or benign intracranial hypertension and if we should put her on a low-dose of Diamox.    I also have to advise her that oral birth control pills may not work well when topiramate Lamictal or other enzyme inducers are taken at the same time and that I recommend a barrier contraception.    Chief concern according to patient :   [name] is a 34 year old right- handed Caucasian female with a new onset headache that is described as a retro-orbital pressure.  The evaluation by optometrist  revealed optic disc drusen and 2018, in 2018 she was also diagnosed with benign intracranial hypertension.  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  She is not sure what led to the new symptom onset.  She does have several risk factors including a BMI of 42.6, she has elevated blood pressure at baseline she is a smoker, she was not on any tetracycline antibiotics, and not on Retin-A.  She remains with impaired vision.  No photophobia presently.   She  has a past medical history of Abnormal Pap smear, Anxiety, Depression, GERD (gastroesophageal reflux disease), , Intracranial pressure Headache(784.0),  Polycystic ovarian syndrome with hirsutism, irregular menses, HPV (human papilloma virus) infection, scarlet fever (1996), Migraines, and  PVC (premature ventricular contraction). She is aloud snorer.  A sleep evalution will follow later.  1)  Benign intracranial hypertension with retro-orbital pressure signs and symptoms.  2)  risk factor is BMI, possible sleep apnea, migraine triptans, and PCOS    My Plan is to proceed with: 1) CT and spinal tap ordered, opening pressure,  Protein, glucose and cells, patient requests sedation.  2) start Topiramate po , 50 mg bid for now.  3) HST  In the next couple of month.  4) PCOS- Dr Tenny Craw at [facility].        Family medical /sleep history: maternal family with HA, migraines.    Social history:  Patient is working as a Engineer, civil (consulting) and lives in a household with 5 persons. Family status is married , with 3 children.  The patient currently works/ day time. Pets are present, cats and dogs. Tobacco use- cig. 2 a day.   ETOH use - seldomly , Caffeine intake in form of Coffee( 1 cup a day ) Soda( 2 a day ) - no energy drinks. Regular exercise- none .       Sleep habits are as follows: The patient's dinner time is between 6-7 PM. The patient goes to bed at 10 PM and continues to sleep for 4-5 hours, wakes for 1 bathroom break.    The preferred sleep position is laterally, with the support of 3 pillows. Dreams are reportedly frequent/vivid.  6.30 AM is the usual rise time. The patient wakes up spontaneously  She reports not feeling refreshed or restored in AM, with symptoms such as dry mouth, morning headaches and sometimes she is woken by headaches. ,  Review of Systems: Out of a complete 14 system review, the patient complains of only the following symptoms, and all other reviewed systems are negative.:  Fatigue, sleepiness , snoring, headaches in sleep and after sleep. Pressure retro-orbitally.    How likely are you to doze in the following situations: 0 = not likely, 1 = slight chance, 2 = moderate chance, 3 = high chance    Total = 11/ 24 points   Social History   Socioeconomic History  . Marital status: Married    Spouse name: Not on file  . Number of children: 2  . Years of education: Not on file  . Highest education level: Not on file  Occupational History  . Occupation: stay at home mother  Tobacco Use  . Smoking status: Current Every Day Smoker    Packs/day: 0.25    Years: 8.00    Pack years: 2.00    Types: Cigarettes  . Smokeless tobacco: Never Used  . Tobacco comment: 2 cig a day  Vaping Use  . Vaping Use: Never used  Substance and Sexual Activity  . Alcohol use: Yes    Comment: occ  . Drug use: No  . Sexual activity: Yes    Birth control/protection: Surgical  Other Topics Concern  . Not on file  Social History Narrative  . Not on file   Social Determinants of Health   Financial Resource Strain:   . Difficulty of Paying Living Expenses: Not on file  Food Insecurity:   . Worried About Programme researcher, broadcasting/film/video in the Last Year: Not on file  . Ran Out of Food in the Last Year: Not on file  Transportation Needs:   . Lack of Transportation (Medical): Not on file  . Lack of Transportation (Non-Medical): Not on file  Physical Activity:   .  Days of Exercise per Week: Not on file  .  Minutes of Exercise per Session: Not on file  Stress:   . Feeling of Stress : Not on file  Social Connections:   . Frequency of Communication with Friends and Family: Not on file  . Frequency of Social Gatherings with Friends and Family: Not on file  . Attends Religious Services: Not on file  . Active Member of Clubs or Organizations: Not on file  . Attends BankerClub or Organization Meetings: Not on file  . Marital Status: Not on file    Family History  Problem Relation Age of Onset  . Heart disease Mother   . Heart murmur Mother   . Heart disease Father   . Hypertension Father   . Heart attack Father        twice  . Liver cancer Father   . Lung cancer Father        stage 4  . Cancer Father        lung and liver, dx 12/2011  . Asthma Daughter   . Asthma Son   . Anesthesia problems Neg Hx   . Hypotension Neg Hx   . Malignant hyperthermia Neg Hx   . Pseudochol deficiency Neg Hx   . Hearing loss Neg Hx     Past Medical History:  Diagnosis Date  . Abnormal Pap smear   . Anxiety   . Depression   . GERD (gastroesophageal reflux disease)    pregnancy related indigestion-tums prn  . Gonorrhea   . Headache(784.0)   . HPV (human papilloma virus) infection    age 34  . Hx of scarlet fever 1996  . Migraines   . PVC (premature ventricular contraction)    history of pvc's-started in early preg 5/13 saw [facility] -told to reduce caffeine intake-pvc's have stopped    Past Surgical History:  Procedure Laterality Date  . CESAREAN SECTION  2008, 2009  . TUBAL LIGATION    . WISDOM TOOTH EXTRACTION       Current Outpatient Medications on File Prior to Visit  Medication Sig Dispense Refill  . acetaminophen (TYLENOL) 500 MG tablet Take 1,000 mg by mouth every 6 (six) hours as needed.    Marland Kitchen. acetaZOLAMIDE (DIAMOX SEQUELS) 500 MG capsule Take 1 capsule (500 mg total) by mouth 2 (two) times daily. 60 capsule 5  . ALPRAZolam (XANAX) 1 MG tablet Pre procedure anxiolytic for LP. 2 tablet 0   . ALTAVERA 0.15-30 MG-MCG tablet Take 1 tablet by mouth every morning.  4  . buPROPion (WELLBUTRIN XL) 150 MG 24 hr tablet Take 450 mg by mouth at bedtime.     Marland Kitchen. doxycycline (VIBRAMYCIN) 100 MG capsule Take 1 capsule (100 mg total) by mouth 2 (two) times daily. 14 capsule 0  . fluticasone (FLONASE) 50 MCG/ACT nasal spray Place 2 sprays into both nostrils daily. 16 g 6  . ibuprofen (ADVIL,MOTRIN) 200 MG tablet Take 800 mg by mouth every 6 (six) hours as needed.    . lamoTRIgine (LAMICTAL) 100 MG tablet Take 1 tablet (100 mg total) by mouth 2 (two) times daily. 60 tablet 5  . lisinopril (ZESTRIL) 20 MG tablet Take 20 mg by mouth daily.    . Melatonin 3 MG TABS Take 3 mg by mouth at bedtime.    . Multiple Vitamin (MULTIVITAMIN WITH MINERALS) TABS tablet Take 1 tablet by mouth daily.    Marland Kitchen. omeprazole (PRILOSEC) 40 MG capsule Take 1 capsule by mouth daily.    .Marland Kitchen  rizatriptan (MAXALT-MLT) 10 MG disintegrating tablet Take 1 tablet (10 mg total) by mouth as needed for migraine. May repeat in 2 hours if needed 20 tablet 0  . [DISCONTINUED] SUMAtriptan (IMITREX) 50 MG tablet Take 50 mg by mouth every 2 (two) hours as needed. Patient states that the physician informed her to stop this medication.     No current facility-administered medications on file prior to visit.    Allergies  Allergen Reactions  . No Known Allergies     Physical exam:  Today's Vitals   06/06/20 1530 06/06/20 1531 06/06/20 1532  BP: 132/83 126/77 124/78  Pulse: 83 83 84  Weight: 230 lb (104.3 kg)    Height:  (1.575 m)     Body mass index is 42.07 kg/m.   Wt Readings from Last 3 Encounters:  06/06/20 230 lb (104.3 kg)  03/04/20 (!) 232 lb (105.2 kg)  01/27/20 233 lb (105.7 kg)     Ht Readings from Last 3 Encounters:  06/06/20  (1.575 m)  03/04/20  (1.575 m)  01/27/20  (1.575 m)      General: The patient is awake, alert and appears not in acute distress. The patient is well groomed. Head:  Normocephalic, atraumatic. Neck is supple. Mallampati 3,  neck circumference: 19 inches (!) . Nasal airflow barely patent.  Retrognathia is not there is a small mouth and she can't open wide, scalloped tongue is seen.   Cardiovascular:  Regular rate and cardiac rhythm by pulse,  without distended neck veins. Respiratory: Lungs are clear to auscultation.  Skin:  facial redness, hirsutism, large neck, acne.   Trunk: The patient's posture is erect.   Neurologic exam : The patient is awake and alert, oriented to place and time.   Memory subjective described as intact.  Attention span & concentration ability appears normal.  Speech is fluent,  without  dysarthria, dysphonia or aphasia.  Mood and affect are appropriate.   Cranial nerves: no loss of smell or taste reported  Pupils are equal and briskly reactive to light. Funduscopic exam  reveals pallor in the right optic disc margin, blurred margin, normal vessels.  Left mildly blurred disc margins  - she feels more pressure and floaters in the right eye - she  Has been keeping a journal and the floaters have improved after LP.   Eye pain when directing gaze to the right.  Extraocular movements in vertical and horizontal planes were intact and without nystagmus.  No Diplopia. I tried to provoke her with squatting down and beding over maneuvers- she felt dizzy and unwell but there was no nystagmus.  Visual fields by finger perimetry are intact. Hearing was intact to soft voice and finger rubbing. Facial sensation: abnormal  tingling ( DIAMOX ?) . Facial motor strength is symmetric and tongue and uvula move midline.  Neck ROM :  Feels very stiff, neck is very large. rotation, tilt and flexion extension were normal for age and shoulder shrug was symmetrical.    Motor exam:  Symmetric bulk, tone and ROM.   Normal tone without cog wheeling, symmetric grip strength . Sensory:  Fine touch,and vibration are felt normal since TPM was discontinued. .   Proprioception tested in the upper extremities was normal.  Coordination: Rapid alternating movements in the fingers/hands were of normal speed.  The Finger-to-nose maneuver was intact without evidence of ataxia, dysmetria or tremor.  Gait and station: Patient could rise unassisted from a seated position, walked without assistive  device.  Stance is of normal width/ base.  Toe and heel walk were deferred.  Deep tendon reflexes: in the  upper and lower extremities are symmetric and intact.  Babinski response was deferred.         After spending a total time of 40 minutes face to face and additional time for physical and neurologic examination, review of laboratory studies,  personal review of imaging studies, reports and results of other testing and review of referral information / records as far as provided in visit, I have established the following assessments:  Assessement )    DEAR DR BOYD,  I found myself in a bind with [place] today- she is only insurance covered for [employer] providers and needs your hel:   1) the patient has neither tolerated TPM, not Diamox, I will reduce Diamox to 500 mg once a day 0 or 250 mg bid- this should help with orthostasis dizziness and tingling.  2) no MRI brain was obtained yet- insurance is limiting her access- CT was done  pre CSF testing. She has access to [facility] medical care only- per her BCBS.  I would like to thank Dr Leavy Cella,  her new PCP at [facility] [place], if she can complete the work up-   Only through the [employer] can she have coverage for an MRI - can her FP PCP order it ???? Can you send her for PCOS evalution. I like your idea of starting her on Ozempic.    In short, SEKAI NAYAK is presenting with residual HA and possible optic neuritis findings. MRI is needed. Opening pressure at 16 cm water was not very high, rather normal for her BMI. Sleep evaluation is at this time urgent- morning headaches and cluster headaches described.    I  plan to follow up either personally or through our NP after MRI .   Electronically signed by: Melvyn Novas, MD 06/06/2020 3:51 PM  [facility] and [facility] Sleep Board certified by The [employer] of Sleep Medicine and Diplomate of the [employer] of Sleep Medicine. Board certified In Neurology through the ABPN, Fellow of the [employer] of Neurology. Medical Director of [facility].

## 2020-06-10 ENCOUNTER — Other Ambulatory Visit: Payer: Self-pay | Admitting: Neurology

## 2020-06-10 DIAGNOSIS — H5711 Ocular pain, right eye: Secondary | ICD-10-CM

## 2020-06-10 DIAGNOSIS — G44019 Episodic cluster headache, not intractable: Secondary | ICD-10-CM

## 2020-06-10 DIAGNOSIS — T50905S Adverse effect of unspecified drugs, medicaments and biological substances, sequela: Secondary | ICD-10-CM

## 2020-06-10 DIAGNOSIS — G932 Benign intracranial hypertension: Secondary | ICD-10-CM

## 2020-11-10 ENCOUNTER — Encounter: Payer: Self-pay | Admitting: Neurology

## 2020-11-12 ENCOUNTER — Other Ambulatory Visit: Payer: Self-pay | Admitting: Neurology

## 2020-11-12 NOTE — Telephone Encounter (Signed)
Dear Ms. Joe ,   I am sorry for your troubles and the return of headaches as well.  You can see me or my office whenever needed and we will make a payment plan.  Does this exclusion of coverage include all tests through [facility] wouldn't be covered either?   Can you give me permission to look into your medical record and see what has been done in the meantime?   There may be a way to coordinate care with the [facility] providers.    Sincerely Micca Matura D.    ===View-only below this line===   ----- Message -----      From:Mariette M Lear      Sent:11/10/2020 10:05 PM EDT        ZO:[id number] Solace Manwarren, MD   Subject:Returning patient help  Hello, My name is [name]. I have been a patient with you off and on for a few years now. I recently left your practice due to insurance not covering [facility]. They only allow me [facility], however, I need to be seen by you as my primary neurologist. 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 I have all the symptoms and the headaches are not migraines. I have suffered with migraines my whole life and this is not migraines. I have neck pain, nothing I take eases it off, I am often times woken in the middle of the night with a headache if I set up to sleep or on an incline it will help the headaches to not be as intense.. I am on Diamox 1,000mg  a day that I was prescribed by you and this had been helping me. I seen a neurologist to get established and then one thing led to another. I am 100% certain this is more than migraines. I have a question about the lumbar puncture. I felt relief from that with the  headaches for a bit along with the diamox it has helped. I didn't know if a lumbar puncture would be affected when I had it done after being on topamax for a few months before I had that done.  I think that would cause an inaccurate opening pressure. I just need help and you are the only neurologist  that has listened to my concerns. I am so frustrated with all this. I know my body and this is not normal. I am willing to be a private pay if there is a way I can do payment plans. Thank you for your help.

## 2020-11-13 ENCOUNTER — Other Ambulatory Visit: Payer: Self-pay | Admitting: Neurology

## 2020-11-13 MED ORDER — RIZATRIPTAN BENZOATE 10 MG PO TBDP: 10.0000 mg | ORAL_TABLET | ORAL | 0 refills | Status: DC | PRN

## 2020-11-13 MED ORDER — CYCLOBENZAPRINE HCL 10 MG PO TABS
10.0000 mg | ORAL_TABLET | Freq: Every evening | ORAL | 5 refills | Status: DC | PRN
Start: 2020-11-13 — End: 2021-06-02

## 2020-11-13 MED ORDER — ACETAZOLAMIDE 250 MG PO TABS: 250.0000 mg | ORAL_TABLET | Freq: Every day | ORAL | 5 refills | Status: DC

## 2020-11-13 NOTE — Addendum Note (Signed)
Addended by: Judi Cong on: 11/13/2020 05:31 PM   Modules accepted: Orders

## 2020-11-24 ENCOUNTER — Encounter: Payer: Self-pay | Admitting: Neurology

## 2020-12-01 ENCOUNTER — Other Ambulatory Visit: Payer: Self-pay | Admitting: Neurology

## 2020-12-07 ENCOUNTER — Other Ambulatory Visit: Payer: Self-pay

## 2020-12-07 ENCOUNTER — Encounter (HOSPITAL_BASED_OUTPATIENT_CLINIC_OR_DEPARTMENT_OTHER): Payer: Self-pay

## 2020-12-07 ENCOUNTER — Emergency Department (HOSPITAL_BASED_OUTPATIENT_CLINIC_OR_DEPARTMENT_OTHER)
Admission: EM | Admit: 2020-12-07 | Discharge: 2020-12-07 | Disposition: A | Discharge status: Home / Self Care | Payer: BLUE CROSS/BLUE SHIELD | Attending: Emergency Medicine | Admitting: Emergency Medicine

## 2020-12-07 DIAGNOSIS — Z79899 Other long term (current) drug therapy: Secondary | ICD-10-CM | POA: Insufficient documentation

## 2020-12-07 DIAGNOSIS — I10 Essential (primary) hypertension: Secondary | ICD-10-CM | POA: Insufficient documentation

## 2020-12-07 DIAGNOSIS — F17210 Nicotine dependence, cigarettes, uncomplicated: Secondary | ICD-10-CM | POA: Insufficient documentation

## 2020-12-07 DIAGNOSIS — R519 Headache, unspecified: Secondary | ICD-10-CM | POA: Diagnosis present

## 2020-12-07 MED ORDER — DEXAMETHASONE SODIUM PHOSPHATE 10 MG/ML IJ SOLN
10.0000 mg | Freq: Once | INTRAMUSCULAR | Status: AC
  Administered 2020-12-07: 10 mg via INTRAVENOUS
  Filled 2020-12-07: qty 1

## 2020-12-07 MED ORDER — KETOROLAC TROMETHAMINE 30 MG/ML IJ SOLN
30.0000 mg | Freq: Once | INTRAMUSCULAR | Status: AC
  Administered 2020-12-07: 30 mg via INTRAVENOUS
  Filled 2020-12-07: qty 1

## 2020-12-07 MED ORDER — PROMETHAZINE HCL 25 MG/ML IJ SOLN
INTRAMUSCULAR | Status: AC
  Filled 2020-12-07: qty 1

## 2020-12-07 MED ORDER — SODIUM CHLORIDE 0.9 % IV SOLN
25.0000 mg | Freq: Once | INTRAVENOUS | Status: AC
  Administered 2020-12-07: 25 mg via INTRAVENOUS
  Filled 2020-12-07: qty 1

## 2020-12-07 MED ORDER — SODIUM CHLORIDE 0.9 % IV BOLUS
1000.0000 mL | Freq: Once | INTRAVENOUS | Status: AC
  Administered 2020-12-07: 1000 mL via INTRAVENOUS

## 2020-12-07 MED ORDER — ONDANSETRON 4 MG PO TBDP
4.0000 mg | ORAL_TABLET | Freq: Three times a day (TID) | ORAL | 0 refills | Status: AC | PRN
Start: 2020-12-07 — End: ?

## 2020-12-07 NOTE — ED Provider Notes (Signed)
[facility] EMERGENCY DEPT Provider Note   CSN: [id number] Arrival date & time: 12/07/20  1204     History Chief Complaint  Patient presents with  . Migraine    [name] is a 35 y.o. female.  She has a throbbing, right-sided headache that feels like a migraine headache.  However, she also has idiopathic intracranial hypertension, and it is hard to tell the difference between the 2.  No red flag symptoms such as numbness, tingling, or weakness.  She typically does get a little bit of mild neck pain with her headaches, and she has some today.  She is vomiting up her migraine medicine and is worried about becoming dehydrated.  The history is provided by the patient.  Migraine This is a new problem. The current episode started 6 to 12 hours ago. The problem occurs constantly. The problem has not changed since onset.Pertinent negatives include no chest pain, no abdominal pain and no shortness of breath. Exacerbated by: light, smells. Nothing relieves the symptoms. Treatments tried: maxalt.       Past Medical History:  Diagnosis Date  . Abnormal Pap smear   . Anxiety   . Depression   . GERD (gastroesophageal reflux disease)    pregnancy related indigestion-tums prn  . Gonorrhea   . Headache(784.0)   . HPV (human papilloma virus) infection    age 28  . Hx of scarlet fever 1996  . Migraines   . PVC (premature ventricular contraction)    history of pvc's-started in early preg 5/13 saw [facility] -told to reduce caffeine intake-pvc's have stopped    Patient Active Problem List   Diagnosis Date Noted  . Medication side effect, sequela 06/06/2020  . Benign intracranial hypertension 06/06/2020  . PCOS (polycystic ovarian syndrome) 06/06/2020  . Vision changes 10/19/2016  . Snoring 10/19/2016  . Sleep related headaches 10/19/2016  . Morbid obesity (HCC) 10/19/2016  . BMI 35.0-35.9,adult 02/17/2013  . Spondylolisthesis at L5-S1 level-grade 1 02/17/2013  .  Migraine with aura and with status migrainosus, not intractable 12/16/2012  . Tension headache 12/16/2012  . Round ligament pain 01/11/2012  . Back pain in pregnancy 01/11/2012  . Heart palpitations 12/11/2011    Past Surgical History:  Procedure Laterality Date  . CESAREAN SECTION  2008, 2009  . TUBAL LIGATION    . WISDOM TOOTH EXTRACTION       OB History    Gravida  3   Para  3   Term  3   Preterm      AB      Living  3     SAB      IAB      Ectopic      Multiple      Live Births  3           Family History  Problem Relation Age of Onset  . Heart disease Mother   . Heart murmur Mother   . Heart disease Father   . Hypertension Father   . Heart attack Father        twice  . Liver cancer Father   . Lung cancer Father        stage 4  . Cancer Father        lung and liver, dx 12/2011  . Asthma Daughter   . Asthma Son   . Anesthesia problems Neg Hx   . Hypotension Neg Hx   . Malignant hyperthermia Neg Hx   . Pseudochol deficiency  Neg Hx   . Hearing loss Neg Hx     Social History   Tobacco Use  . Smoking status: Current Every Day Smoker    Packs/day: 0.25    Years: 8.00    Pack years: 2.00    Types: Cigarettes  . Smokeless tobacco: Never Used  . Tobacco comment: 2 cig a day  Vaping Use  . Vaping Use: Never used  Substance Use Topics  . Alcohol use: Yes    Comment: occ  . Drug use: No    Home Medications Prior to Admission medications   Medication Sig Start Date End Date Taking? Authorizing Provider  acetaminophen (TYLENOL) 500 MG tablet Take 1,000 mg by mouth every 6 (six) hours as needed.    [provider]  acetaZOLAMIDE (DIAMOX) 250 MG tablet Take 1 tablet (250 mg total) by mouth daily. 11/13/20   Dohmeier, Porfirio Mylar, MD  ALPRAZolam Prudy Feeler) 1 MG tablet Pre procedure anxiolytic for LP. 01/17/20   Dohmeier, Porfirio Mylar, MD  ALTAVERA 0.15-30 MG-MCG tablet Take 1 tablet by mouth every morning. 11/07/15   [provider]   buPROPion (WELLBUTRIN XL) 150 MG 24 hr tablet Take 450 mg by mouth at bedtime.     [provider]  cyclobenzaprine (FLEXERIL) 10 MG tablet Take 1 tablet (10 mg total) by mouth at bedtime as needed for muscle spasms (tension headache). 11/13/20   Dohmeier, Porfirio Mylar, MD  doxycycline (VIBRAMYCIN) 100 MG capsule Take 1 capsule (100 mg total) by mouth 2 (two) times daily. 04/19/20   Harris, Abigail, PA-C  fluticasone (FLONASE) 50 MCG/ACT nasal spray Place 2 sprays into both nostrils daily. 11/02/18   Daphine Deutscher, Mary-Margaret, FNP  ibuprofen (ADVIL,MOTRIN) 200 MG tablet Take 800 mg by mouth every 6 (six) hours as needed.    [provider]  lamoTRIgine (LAMICTAL) 100 MG tablet Take 1 tablet (100 mg total) by mouth 2 (two) times daily. 03/04/20   Dohmeier, Porfirio Mylar, MD  lisinopril (ZESTRIL) 20 MG tablet Take 20 mg by mouth daily.    [provider]  Melatonin 3 MG TABS Take 3 mg by mouth at bedtime.    [provider]  Multiple Vitamin (MULTIVITAMIN WITH MINERALS) TABS tablet Take 1 tablet by mouth daily.    [provider]  omeprazole (PRILOSEC) 40 MG capsule Take 1 capsule by mouth daily.    [provider]  rizatriptan (MAXALT-MLT) 10 MG disintegrating tablet Take 1 tablet (10 mg total) by mouth as needed for migraine. May repeat in 2 hours if needed 11/13/20   Dohmeier, Porfirio Mylar, MD  SUMAtriptan (IMITREX) 50 MG tablet Take 50 mg by mouth every 2 (two) hours as needed. Patient states that the physician informed her to stop this medication.  10/11/11  [provider]    Allergies    No known allergies  Review of Systems   Review of Systems  Constitutional: Negative for chills and fever.  HENT: Negative for ear pain and sore throat.   Eyes: Negative for pain and visual disturbance.  Respiratory: Negative for cough and shortness of breath.   Cardiovascular: Negative for chest pain and palpitations.  Gastrointestinal: Positive for nausea and vomiting.  Negative for abdominal pain.  Genitourinary: Negative for dysuria and hematuria.  Musculoskeletal: Positive for neck pain. Negative for arthralgias and back pain.  Skin: Negative for color change and rash.  Neurological: Negative for seizures and syncope.  All other systems reviewed and are negative.   Physical Exam Updated Vital Signs BP (!) 142/85 (BP  Location: Right Arm)   Pulse 84   Temp 98.5 F (36.9 C) (Oral)   Resp 18   Ht 5\' 2"  (1.575 m)   Wt 104.3 kg   LMP 12/05/2020 (Exact Date)   SpO2 100%   BMI 42.07 kg/m   Physical Exam Vitals and nursing note reviewed.  HENT:     Head: Normocephalic and atraumatic.  Eyes:     General: No scleral icterus. Pulmonary:     Effort: Pulmonary effort is normal. No respiratory distress.  Musculoskeletal:     Cervical back: Normal range of motion.  Skin:    General: Skin is warm and dry.  Neurological:     General: No focal deficit present.     Mental Status: She is alert and oriented to person, place, and time. Mental status is at baseline.  Psychiatric:        Mood and Affect: Mood normal.     ED Results / Procedures / Treatments   Labs (all labs ordered are listed, but only abnormal results are displayed) Labs Reviewed - No data to display  EKG None  Radiology No results found.  Procedures Procedures   Medications Ordered in ED Medications  sodium chloride 0.9 % bolus 1,000 mL (has no administration in time range)  ketorolac (TORADOL) 30 MG/ML injection 30 mg (has no administration in time range)  promethazine (PHENERGAN) 25 mg in sodium chloride 0.9 % 50 mL IVPB (has no administration in time range)  dexamethasone (DECADRON) injection 10 mg (has no administration in time range)    ED Course  I have reviewed the triage vital signs and the nursing notes.  Pertinent labs & imaging results that were available during my care of the patient were reviewed by me and considered in my medical decision making (see  chart for details).    MDM Rules/Calculators/A&P                          Latesia ELIDE STALZER has a history of migraine headache as well as idiopathic intracranial hypertension.  She presented with what appeared to be a migraine headache.  No red flag symptoms to suggest imaging or other work-up was necessary.  She had good pain relief with migraine medication and was discharged home.  She does state that when she develops a severe migraine headache, she has difficulty with nausea and vomiting.  Therefore, I did prescribe her Zofran to use as needed severe symptoms.  She was concerned about becoming dehydrated secondary to her Diamox use. Final Clinical Impression(s) / ED Diagnoses Final diagnoses:  Acute nonintractable headache, unspecified headache type    Rx / DC Orders ED Discharge Orders         Ordered    ondansetron (ZOFRAN ODT) 4 MG disintegrating tablet  Every 8 hours PRN        12/07/20 1451           02/06/21, MD 12/07/20 1454

## 2020-12-07 NOTE — ED Triage Notes (Signed)
She rerts typical migraine since this morning at ~0500 hours. She also reports 4 episodes of N/V. She is awake, alert and oriented x 4 with clear speech. Her sister is with her.

## 2021-01-09 ENCOUNTER — Ambulatory Visit: Payer: Self-pay | Admitting: Neurology

## 2021-04-02 ENCOUNTER — Other Ambulatory Visit: Payer: Self-pay

## 2021-04-02 ENCOUNTER — Encounter: Payer: Self-pay | Admitting: Neurology

## 2021-04-02 ENCOUNTER — Other Ambulatory Visit: Payer: Self-pay | Admitting: Neurology

## 2021-04-02 MED ORDER — ACETAZOLAMIDE 250 MG PO TABS: 250.0000 mg | ORAL_TABLET | Freq: Two times a day (BID) | ORAL | 1 refills | Status: DC

## 2021-04-02 MED ORDER — RIZATRIPTAN BENZOATE 10 MG PO TBDP: 10.0000 mg | ORAL_TABLET | ORAL | 3 refills | Status: AC | PRN

## 2021-04-02 MED ORDER — ACETAZOLAMIDE 250 MG PO TABS: 250.0000 mg | ORAL_TABLET | Freq: Two times a day (BID) | ORAL | 1 refills | Status: AC

## 2021-05-21 ENCOUNTER — Encounter: Payer: Self-pay | Admitting: Neurology

## 2021-06-01 ENCOUNTER — Other Ambulatory Visit: Payer: Self-pay | Admitting: Neurology

## 2021-06-06 ENCOUNTER — Telehealth: Payer: Self-pay | Admitting: Neurology

## 2021-06-06 NOTE — Telephone Encounter (Signed)
[facility]) need clarification on directions for cyclobenzaprine (FLEXERIL) 10 MG tablet  . Would like a call from the nurse.

## 2021-06-09 ENCOUNTER — Other Ambulatory Visit: Payer: Self-pay | Admitting: Neurology

## 2021-06-09 MED ORDER — CYCLOBENZAPRINE HCL 10 MG PO TABS: 10.0000 mg | ORAL_TABLET | Freq: Every day | ORAL | 0 refills | Status: DC | PRN

## 2021-06-09 MED ORDER — CYCLOBENZAPRINE HCL 10 MG PO TABS: 10.0000 mg | ORAL_TABLET | ORAL | 0 refills | Status: DC | PRN

## 2021-06-09 NOTE — Telephone Encounter (Signed)
I have corrected the script to reflect the directions for the patient and resent the script to [facility].  **If they call back still wanting to clarify the order is cyclobenzaprine 10 mg -Take 1 tablet daily as needed with 30 tablets  and no refills because the patient must schedule a follow up visit before receiving anymore refills.

## 2021-06-11 NOTE — Telephone Encounter (Signed)
[facility] ([facility]) called for further clarification. Asking how many per day can she take. I informed her that this medication needs to be d/c since pt has not been seen and is needing an appt. No further questions at this time.

## 2021-09-07 ENCOUNTER — Other Ambulatory Visit: Payer: Self-pay

## 2021-09-07 ENCOUNTER — Emergency Department (HOSPITAL_COMMUNITY): Payer: BLUE CROSS/BLUE SHIELD

## 2021-09-07 ENCOUNTER — Emergency Department (HOSPITAL_COMMUNITY)
Admission: EM | Admit: 2021-09-07 | Discharge: 2021-09-07 | Disposition: A | Discharge status: Home / Self Care | Payer: BLUE CROSS/BLUE SHIELD | Attending: Emergency Medicine | Admitting: Emergency Medicine

## 2021-09-07 ENCOUNTER — Encounter (HOSPITAL_COMMUNITY): Payer: Self-pay | Admitting: Emergency Medicine

## 2021-09-07 DIAGNOSIS — Z7984 Long term (current) use of oral hypoglycemic drugs: Secondary | ICD-10-CM | POA: Insufficient documentation

## 2021-09-07 DIAGNOSIS — E860 Dehydration: Secondary | ICD-10-CM | POA: Insufficient documentation

## 2021-09-07 DIAGNOSIS — E1165 Type 2 diabetes mellitus with hyperglycemia: Secondary | ICD-10-CM | POA: Diagnosis not present

## 2021-09-07 DIAGNOSIS — Z20822 Contact with and (suspected) exposure to covid-19: Secondary | ICD-10-CM | POA: Diagnosis not present

## 2021-09-07 DIAGNOSIS — R739 Hyperglycemia, unspecified: Secondary | ICD-10-CM

## 2021-09-07 DIAGNOSIS — R5383 Other fatigue: Secondary | ICD-10-CM | POA: Diagnosis present

## 2021-09-07 HISTORY — DX: Type 2 diabetes mellitus without complications: E11.9

## 2021-09-07 LAB — BASIC METABOLIC PANEL
Anion gap: 11 (ref 5–15)
BUN: 7 mg/dL (ref 6–20)
CO2: 19 mmol/L — ABNORMAL LOW (ref 22–32)
Calcium: 8.9 mg/dL (ref 8.9–10.3)
Chloride: 106 mmol/L (ref 98–111)
Creatinine, Ser: 0.58 mg/dL (ref 0.44–1.00)
GFR, Estimated: >60 mL/min (ref >60)
Glucose, Bld: 295 mg/dL — ABNORMAL HIGH (ref 70–99)
Potassium: 3.4 mmol/L — ABNORMAL LOW (ref 3.5–5.1)
Sodium: 136 mmol/L (ref 135–145)

## 2021-09-07 LAB — CBC
HCT: 43 % (ref 36.0–46.0)
Hemoglobin: 14.2 g/dL (ref 12.0–15.0)
MCH: 30.5 pg (ref 26.0–34.0)
MCHC: 33 g/dL (ref 30.0–36.0)
MCV: 92.3 fL (ref 80.0–100.0)
Platelets: 308 10*3/uL (ref 150–400)
RBC: 4.66 MIL/uL (ref 3.87–5.11)
RDW: 13.6 % (ref 11.5–15.5)
WBC: 8.6 10*3/uL (ref 4.0–10.5)
nRBC: 0 % (ref 0.0–0.2)

## 2021-09-07 LAB — HEPATIC FUNCTION PANEL
ALT: 90 U/L — ABNORMAL HIGH (ref 0–44)
AST: 109 U/L — ABNORMAL HIGH (ref 15–41)
Albumin: 3.6 g/dL (ref 3.5–5.0)
Alkaline Phosphatase: 90 U/L (ref 38–126)
Bilirubin, Direct: <0.1 mg/dL (ref 0.0–0.2)
Total Bilirubin: 0.4 mg/dL (ref 0.3–1.2)
Total Protein: 7.2 g/dL (ref 6.5–8.1)

## 2021-09-07 LAB — URINALYSIS, ROUTINE W REFLEX MICROSCOPIC
Bilirubin Urine: NEGATIVE
Glucose, UA: >=500 mg/dL — AB
Hgb urine dipstick: NEGATIVE
Ketones, ur: NEGATIVE mg/dL
Leukocytes,Ua: NEGATIVE
Nitrite: NEGATIVE
Protein, ur: NEGATIVE mg/dL
Specific Gravity, Urine: 1.01 (ref 1.005–1.030)
pH: 6.5 (ref 5.0–8.0)

## 2021-09-07 LAB — URINALYSIS, MICROSCOPIC (REFLEX)

## 2021-09-07 LAB — CBG MONITORING, ED
Glucose-Capillary: 266 mg/dL — ABNORMAL HIGH (ref 70–99)
Glucose-Capillary: 143 mg/dL — ABNORMAL HIGH (ref 70–99)

## 2021-09-07 LAB — RESP PANEL BY RT-PCR (FLU A&B, COVID) ARPGX2
Influenza A by PCR: NEGATIVE
Influenza B by PCR: NEGATIVE
SARS Coronavirus 2 by RT PCR: NEGATIVE

## 2021-09-07 MED ORDER — METFORMIN HCL 500 MG PO TABS: 500.0000 mg | ORAL_TABLET | Freq: Two times a day (BID) | ORAL | 0 refills | Status: AC

## 2021-09-07 MED ORDER — SODIUM CHLORIDE 0.9 % IV BOLUS
1000.0000 mL | Freq: Once | INTRAVENOUS | Status: AC
  Administered 2021-09-07: 1000 mL via INTRAVENOUS

## 2021-09-07 MED ORDER — METFORMIN HCL 500 MG PO TABS
500.0000 mg | ORAL_TABLET | Freq: Once | ORAL | Status: AC
Start: 2021-09-07 — End: 2021-09-07
  Administered 2021-09-07: 500 mg via ORAL
  Filled 2021-09-07: qty 1

## 2021-09-07 MED ORDER — INSULIN ASPART 100 UNIT/ML IJ SOLN
4.0000 [IU] | Freq: Once | INTRAMUSCULAR | Status: AC
Start: 2021-09-07 — End: 2021-09-07
  Administered 2021-09-07: 4 [IU] via INTRAVENOUS

## 2021-09-07 NOTE — ED Triage Notes (Signed)
Patient reports hyperglycemia past two days. C/o fatigue, blurred vision, headache and shortness of breath with exertion. States she has not had her diabetes meds for past 3 weeks.   CBG 266 in triage

## 2021-09-07 NOTE — ED Provider Notes (Signed)
[facility] EMERGENCY DEPARTMENT Provider Note   CSN: [id number] Arrival date & time: 09/07/21  1728     History  Chief Complaint  Patient presents with   Hyperglycemia    [name] is a 36 y.o. female.  Pt is a 36 yo wf with a hx of DM, depression, anxiety, gerd, and migraines.  She has been out of her Ozempic for the past 3 weeks.  Her co-pay is $500 and she can't afford that.  She said it was controlling her blood sugar.  She woke up this morning with sx of fatigue, blurred vision, headache.  She has also had some sob with exertion.  She ate breakfast and bs read hi.  She has been drinking a lot of water and has not eaten anything.  She still feels bad, so she came in.      Home Medications Prior to Admission medications   Medication Sig Start Date End Date Taking? Authorizing Provider  metFORMIN (GLUCOPHAGE) 500 MG tablet Take 1 tablet (500 mg total) by mouth 2 (two) times daily with a meal. 09/07/21  Yes Jacalyn Lefevre, MD  acetaminophen (TYLENOL) 500 MG tablet Take 1,000 mg by mouth every 6 (six) hours as needed.    [provider]  acetaZOLAMIDE (DIAMOX) 250 MG tablet Take 1 tablet (250 mg total) by mouth 2 (two) times daily. 04/02/21   Dohmeier, Porfirio Mylar, MD  ALPRAZolam Prudy Feeler) 1 MG tablet Pre procedure anxiolytic for LP. 01/17/20   Dohmeier, Porfirio Mylar, MD  ALTAVERA 0.15-30 MG-MCG tablet Take 1 tablet by mouth every morning. 11/07/15   [provider]  buPROPion (WELLBUTRIN XL) 150 MG 24 hr tablet Take 450 mg by mouth at bedtime.     [provider]  cyclobenzaprine (FLEXERIL) 10 MG tablet Take 1 tablet (10 mg total) by mouth daily as needed for muscle spasms. Please call and make overdue appt for further refills 06/09/21   Dohmeier, Porfirio Mylar, MD  doxycycline (VIBRAMYCIN) 100 MG capsule Take 1 capsule (100 mg total) by mouth 2 (two) times daily. 04/19/20   Harris, Abigail, PA-C  fluticasone (FLONASE) 50 MCG/ACT nasal spray Place 2 sprays  into both nostrils daily. 11/02/18   Daphine Deutscher, Mary-Margaret, FNP  ibuprofen (ADVIL,MOTRIN) 200 MG tablet Take 800 mg by mouth every 6 (six) hours as needed.    [provider]  lamoTRIgine (LAMICTAL) 100 MG tablet Take 1 tablet (100 mg total) by mouth 2 (two) times daily. 03/04/20   Dohmeier, Porfirio Mylar, MD  lisinopril (ZESTRIL) 20 MG tablet Take 20 mg by mouth daily.    [provider]  Melatonin 3 MG TABS Take 3 mg by mouth at bedtime.    [provider]  Multiple Vitamin (MULTIVITAMIN WITH MINERALS) TABS tablet Take 1 tablet by mouth daily.    [provider]  omeprazole (PRILOSEC) 40 MG capsule Take 1 capsule by mouth daily.    [provider]  ondansetron (ZOFRAN ODT) 4 MG disintegrating tablet Take 1 tablet (4 mg total) by mouth every 8 (eight) hours as needed for nausea or vomiting. 12/07/20   Koleen Distance, MD  rizatriptan (MAXALT-MLT) 10 MG disintegrating tablet Take 1 tablet (10 mg total) by mouth as needed for migraine. May repeat in 2 hours if needed 04/02/21   Dohmeier, Porfirio Mylar, MD  SUMAtriptan (IMITREX) 50 MG tablet Take 50 mg by mouth every 2 (two) hours as needed. Patient states that the physician informed her to stop this medication.  10/11/11  [provider]      Allergies    No known allergies    Review of Systems   Review of Systems  Constitutional:  Positive for fatigue.  Respiratory:  Positive for shortness of breath.   Neurological:  Positive for weakness.  All other systems reviewed and are negative.  Physical Exam Updated Vital Signs BP 126/75    Pulse 75    Temp 97.9 F (36.6 C) (Oral)    Resp 18    Ht 5\' 2"  (1.575 m)    Wt 104.3 kg    LMP 12/05/2020 (Exact Date)    SpO2 99%    BMI 42.07 kg/m  Physical Exam Vitals and nursing note reviewed.  Constitutional:      Appearance: Normal appearance.  HENT:     Head: Normocephalic and atraumatic.     Right Ear: External ear normal.     Left Ear: External ear normal.      Nose: Nose normal.     Mouth/Throat:     Mouth: Mucous membranes are dry.  Eyes:     Extraocular Movements: Extraocular movements intact.     Conjunctiva/sclera: Conjunctivae normal.     Pupils: Pupils are equal, round, and reactive to light.  Cardiovascular:     Rate and Rhythm: Normal rate and regular rhythm.     Pulses: Normal pulses.     Heart sounds: Normal heart sounds.  Pulmonary:     Effort: Pulmonary effort is normal.     Breath sounds: Normal breath sounds.  Abdominal:     General: Abdomen is flat. Bowel sounds are normal.     Palpations: Abdomen is soft.  Musculoskeletal:        General: Normal range of motion.     Cervical back: Normal range of motion and neck supple.  Skin:    General: Skin is warm.     Capillary Refill: Capillary refill takes less than 2 seconds.  Neurological:     General: No focal deficit present.     Mental Status: She is alert and oriented to person, place, and time.  Psychiatric:        Mood and Affect: Mood normal.        Behavior: Behavior normal.    ED Results / Procedures / Treatments   Labs (all labs ordered are listed, but only abnormal results are displayed) Labs Reviewed  BASIC METABOLIC PANEL - Abnormal; Notable for the following components:      Result Value   Potassium 3.4 (*)    CO2 19 (*)    Glucose, Bld 295 (*)    All other components within normal limits  URINALYSIS, ROUTINE W REFLEX MICROSCOPIC - Abnormal; Notable for the following components:   Glucose, UA >=500 (*)    All other components within normal limits  HEPATIC FUNCTION PANEL - Abnormal; Notable for the following components:   AST 109 (*)    ALT 90 (*)    All other components within normal limits  URINALYSIS, MICROSCOPIC (REFLEX) - Abnormal; Notable for the following components:   Bacteria, UA RARE (*)    All other components within normal limits  CBG MONITORING, ED - Abnormal; Notable for the following components:   Glucose-Capillary 266 (*)    All  other components within normal limits  CBG MONITORING, ED - Abnormal; Notable for the following components:   Glucose-Capillary 143 (*)    All other components within normal limits  RESP PANEL BY RT-PCR (FLU A&B, COVID) [id number]  CBC  EKG None  Radiology DG Chest Portable 1 View  Result Date: 09/07/2021 CLINICAL DATA:  Shortness of breath. Hyperglycemia. Fatigue, blurred vision, headache. EXAM: PORTABLE CHEST 1 VIEW COMPARISON:  02/13/2014 FINDINGS: The heart size and mediastinal contours are within normal limits. Both lungs are clear. The visualized skeletal structures are unremarkable. IMPRESSION: No active disease. Electronically Signed   By: Burman Nieves M.D.   On: 09/07/2021 19:22    Procedures Procedures    Medications Ordered in ED Medications  metFORMIN (GLUCOPHAGE) tablet 500 mg (has no administration in time range)  sodium chloride 0.9 % bolus 1,000 mL (0 mLs Intravenous Stopped 09/07/21 2005)  insulin aspart (novoLOG) injection 4 Units (4 Units Intravenous Given 09/07/21 1900)    ED Course/ Medical Decision Making/ A&P                           Medical Decision Making Amount and/or Complexity of Data Reviewed Labs: ordered. Radiology: ordered.  Risk Prescription drug management.  This patient presents to the ED for concern of hyperglycemia, this involves an extensive number of treatment options, and is a complaint that carries with it a high risk of complications and morbidity.  The differential diagnosis includes medical noncompliance, infection.   Co morbidities that complicate the patient evaluation  obesity   Additional history obtained:  Additional history obtained from epic    Lab Tests:  I Ordered, and personally interpreted labs.  The pertinent results include:  hyperglycemia (bs 295)   Imaging Studies ordered:  I ordered imaging studies including cxr  I independently visualized and interpreted imaging which showed nothing acute I agree  with the radiologist interpretation   Cardiac Monitoring:  The patient was maintained on a cardiac monitor.  I personally viewed and interpreted the cardiac monitored which showed an underlying rhythm of: nsr   Medicines ordered and prescription drug management:  I ordered medication including regular insulin  4 units iv and IVFs  Reevaluation of the patient after these medicines showed that the patient improved.  BS is down to 143. I have reviewed the patients home medicines and have made adjustments as needed   Test Considered:  Cbc, bmp, ua, covid/flu   Critical Interventions:  IVFs and IV insulin     Problem List / ED Course:  Hyperglycemia is from pt unable to get her Ozempic.  She is willing to try another treatment.  We will start her on Metformin.  She is encouraged to [employer].   Reevaluation:  After the interventions noted above, I reevaluated the patient and found that they have :improved   Social Determinants of Health:  Pt has good follow up.   Dispostion:  After consideration of the diagnostic results and the patients response to treatment, I feel that the patent would benefit from discharge with outpatient f/u.  She is to start metformin.  She has a freestyle Libre glucose monitoring device.    She knows to return if worse.  F/u with pcp. Final Clinical Impression(s) / ED Diagnoses Final diagnoses:  Hyperglycemia  Dehydration    Rx / DC Orders ED Discharge Orders          Ordered    metFORMIN (GLUCOPHAGE) 500 MG tablet  2 times daily with meals        09/07/21 2024              Jacalyn Lefevre, MD 09/07/21 2026

## 2021-09-23 ENCOUNTER — Other Ambulatory Visit: Payer: Self-pay | Admitting: Neurology

## 2021-10-05 ENCOUNTER — Emergency Department (HOSPITAL_COMMUNITY): Payer: BLUE CROSS/BLUE SHIELD

## 2021-10-05 ENCOUNTER — Other Ambulatory Visit: Payer: Self-pay

## 2021-10-05 ENCOUNTER — Emergency Department (HOSPITAL_COMMUNITY)
Admission: EM | Admit: 2021-10-05 | Discharge: 2021-10-06 | Disposition: A | Discharge status: Home / Self Care | Payer: BLUE CROSS/BLUE SHIELD | Attending: Emergency Medicine | Admitting: Emergency Medicine

## 2021-10-05 ENCOUNTER — Encounter (HOSPITAL_COMMUNITY): Payer: Self-pay | Admitting: Emergency Medicine

## 2021-10-05 DIAGNOSIS — E119 Type 2 diabetes mellitus without complications: Secondary | ICD-10-CM | POA: Insufficient documentation

## 2021-10-05 DIAGNOSIS — Z7984 Long term (current) use of oral hypoglycemic drugs: Secondary | ICD-10-CM | POA: Diagnosis not present

## 2021-10-05 DIAGNOSIS — N83201 Unspecified ovarian cyst, right side: Secondary | ICD-10-CM

## 2021-10-05 DIAGNOSIS — R1031 Right lower quadrant pain: Secondary | ICD-10-CM | POA: Insufficient documentation

## 2021-10-05 DIAGNOSIS — N9489 Other specified conditions associated with female genital organs and menstrual cycle: Secondary | ICD-10-CM | POA: Diagnosis not present

## 2021-10-05 DIAGNOSIS — R102 Pelvic and perineal pain: Secondary | ICD-10-CM | POA: Diagnosis not present

## 2021-10-05 LAB — LIPASE, BLOOD: Lipase: 61 U/L — ABNORMAL HIGH (ref 11–51)

## 2021-10-05 LAB — URINALYSIS, ROUTINE W REFLEX MICROSCOPIC
Bilirubin Urine: NEGATIVE
Glucose, UA: NEGATIVE mg/dL
Hgb urine dipstick: NEGATIVE
Ketones, ur: NEGATIVE mg/dL
Leukocytes,Ua: NEGATIVE
Nitrite: NEGATIVE
Protein, ur: NEGATIVE mg/dL
Specific Gravity, Urine: 1.005 (ref 1.005–1.030)
pH: 6 (ref 5.0–8.0)

## 2021-10-05 LAB — COMPREHENSIVE METABOLIC PANEL
ALT: 89 U/L — ABNORMAL HIGH (ref 0–44)
AST: 76 U/L — ABNORMAL HIGH (ref 15–41)
Albumin: 3.9 g/dL (ref 3.5–5.0)
Alkaline Phosphatase: 72 U/L (ref 38–126)
Anion gap: 13 (ref 5–15)
BUN: 10 mg/dL (ref 6–20)
CO2: 18 mmol/L — ABNORMAL LOW (ref 22–32)
Calcium: 9 mg/dL (ref 8.9–10.3)
Chloride: 105 mmol/L (ref 98–111)
Creatinine, Ser: 0.46 mg/dL (ref 0.44–1.00)
GFR, Estimated: >60 mL/min (ref >60)
Glucose, Bld: 120 mg/dL — ABNORMAL HIGH (ref 70–99)
Potassium: 3.3 mmol/L — ABNORMAL LOW (ref 3.5–5.1)
Sodium: 136 mmol/L (ref 135–145)
Total Bilirubin: 0.4 mg/dL (ref 0.3–1.2)
Total Protein: 7.2 g/dL (ref 6.5–8.1)

## 2021-10-05 LAB — CBC
HCT: 41.5 % (ref 36.0–46.0)
Hemoglobin: 13.9 g/dL (ref 12.0–15.0)
MCH: 31.1 pg (ref 26.0–34.0)
MCHC: 33.5 g/dL (ref 30.0–36.0)
MCV: 92.8 fL (ref 80.0–100.0)
Platelets: 354 10*3/uL (ref 150–400)
RBC: 4.47 MIL/uL (ref 3.87–5.11)
RDW: 13.7 % (ref 11.5–15.5)
WBC: 9.3 10*3/uL (ref 4.0–10.5)
nRBC: 0 % (ref 0.0–0.2)

## 2021-10-05 LAB — I-STAT BETA HCG BLOOD, ED (MC, WL, AP ONLY): I-stat hCG, quantitative: <5 m[IU]/mL (ref <5)

## 2021-10-05 MED ORDER — IOHEXOL 300 MG/ML  SOLN
100.0000 mL | Freq: Once | INTRAMUSCULAR | Status: AC | PRN
  Administered 2021-10-05: 100 mL via INTRAVENOUS

## 2021-10-05 MED ORDER — ONDANSETRON HCL 4 MG/2ML IJ SOLN
4.0000 mg | Freq: Once | INTRAMUSCULAR | Status: AC
Start: 2021-10-05 — End: 2021-10-05
  Administered 2021-10-05: 4 mg via INTRAVENOUS
  Filled 2021-10-05: qty 2

## 2021-10-05 MED ORDER — MORPHINE SULFATE (PF) 4 MG/ML IV SOLN
4.0000 mg | Freq: Once | INTRAVENOUS | Status: AC
  Administered 2021-10-05: 4 mg via INTRAVENOUS
  Filled 2021-10-05: qty 1

## 2021-10-05 MED ORDER — MORPHINE SULFATE (PF) 2 MG/ML IV SOLN
2.0000 mg | Freq: Once | INTRAVENOUS | Status: AC
Start: 2021-10-05 — End: 2021-10-05
  Administered 2021-10-05: 2 mg via INTRAVENOUS
  Filled 2021-10-05: qty 1

## 2021-10-05 NOTE — ED Provider Notes (Signed)
[facility] Provider Note   CSN: [id number] Arrival date & time: 10/05/21  1912     History  Chief Complaint  Patient presents with   Abdominal Pain    Leitha Duaine DredgeM Boswell is a 36 y.o. female who presents with gradually worsening right lower quadrant abdominal pain that started today around lunchtime.  Decreased appetite, nausea without vomiting.  1 episode of soft stool this afternoon without melena or hematochezia.  No recent ill contacts.  No urinary symptoms. No melena or hematochezia. No hx of similar pain in the past.   Patient with history of hysterectomy in 2022 but retains bilateral ovaries.  History of GERD, diabetes, HPV, anxiety and depression.  No history of renal stones.  HPI     Home Medications Prior to Admission medications   Medication Sig Start Date End Date Taking? Authorizing Provider  acetaminophen (TYLENOL) 500 MG tablet Take 1,000 mg by mouth every 6 (six) hours as needed.   Yes [provider]  acetaZOLAMIDE (DIAMOX) 250 MG tablet Take 1 tablet (250 mg total) by mouth 2 (two) times daily. 04/02/21  Yes Dohmeier, Porfirio Mylararmen, MD  clonazePAM (KLONOPIN) 0.5 MG tablet Take 0.5 mg by mouth at bedtime as needed for anxiety. 06/18/21  Yes [provider]  cyclobenzaprine (FLEXERIL) 10 MG tablet Take 10 mg by mouth at bedtime as needed for muscle spasms. 11/13/20  Yes [provider]  ibuprofen (ADVIL,MOTRIN) 200 MG tablet Take 800 mg by mouth every 6 (six) hours as needed for headache or moderate pain.   Yes [provider]  lisinopril (ZESTRIL) 20 MG tablet Take 20 mg by mouth at bedtime.   Yes [provider]  metFORMIN (GLUCOPHAGE) 500 MG tablet Take 1 tablet (500 mg total) by mouth 2 (two) times daily with a meal. Patient taking differently: Take 1,000 mg by mouth 2 (two) times daily with a meal. 09/07/21  Yes Jacalyn LefevreHaviland, Julie, MD  Multiple Vitamin (MULTIVITAMIN WITH MINERALS) TABS tablet  Take 1 tablet by mouth daily.   Yes [provider]  omeprazole (PRILOSEC) 40 MG capsule Take 1 capsule by mouth daily.   Yes [provider]  ondansetron (ZOFRAN ODT) 4 MG disintegrating tablet Take 1 tablet (4 mg total) by mouth every 8 (eight) hours as needed for nausea or vomiting. 12/07/20  Yes Koleen DistanceWright, Anna G, MD  rizatriptan (MAXALT-MLT) 10 MG disintegrating tablet Take 1 tablet (10 mg total) by mouth as needed for migraine. May repeat in 2 hours if needed 04/02/21  Yes Dohmeier, Porfirio Mylararmen, MD  tirzepatide [facility](MOUNJARO) 2.5 MG/0.5ML Pen Inject 2.5 mg into the skin every Tuesday. 09/24/21  Yes [provider]  tretinoin (RETIN-A) 0.025 % cream Apply 1 application topically at bedtime. 07/29/21  Yes [provider]  venlafaxine (EFFEXOR) 75 MG tablet Take 75 mg by mouth 2 (two) times daily. 09/24/21  Yes [provider]  cyclobenzaprine (FLEXERIL) 10 MG tablet Take 1 tablet (10 mg total) by mouth daily as needed for muscle spasms. Please call and make overdue appt for further refills Patient not taking: Reported on 10/05/2021 06/09/21   Dohmeier, Porfirio Mylararmen, MD  fluticasone [facility](FLONASE) 50 MCG/ACT nasal spray Place 2 sprays into both nostrils daily. Patient not taking: Reported on 10/05/2021 11/02/18   Bennie PieriniMartin, Mary-Margaret, FNP  lamoTRIgine (LAMICTAL) 100 MG tablet Take 1 tablet (100 mg total) by mouth 2 (two) times daily. Patient not taking: Reported on 10/05/2021 03/04/20   Dohmeier, Porfirio Mylararmen, MD  SUMAtriptan (IMITREX) 50 MG tablet  Take 50 mg by mouth every 2 (two) hours as needed. Patient states that the physician informed her to stop this medication.  10/11/11  [provider]      Allergies    Tape    Review of Systems   Review of Systems  Constitutional:  Positive for appetite change and chills. Negative for activity change, fatigue and fever.  HENT: Negative.    Respiratory: Negative.    Cardiovascular: Negative.   Gastrointestinal:  Positive for abdominal  pain.  Genitourinary:  Positive for vaginal discharge. Negative for decreased urine volume, dysuria, menstrual problem, pelvic pain, urgency, vaginal bleeding and vaginal pain.  Musculoskeletal: Negative.   Skin: Negative.   Neurological: Negative.    Physical Exam Updated Vital Signs BP 119/79    Pulse 72    Temp 98.6 F (37 C) (Oral)    Resp 16    Ht 5\' 2"  (1.575 m)    Wt 104.3 kg    LMP 12/05/2020 (Exact Date)    SpO2 97%    BMI 42.07 kg/m  Physical Exam Vitals and nursing note reviewed.  Constitutional:      Appearance: She is obese. She is not ill-appearing or toxic-appearing.  HENT:     Head: Normocephalic and atraumatic.     Nose: Nose normal.     Mouth/Throat:     Mouth: Mucous membranes are moist.     Pharynx: Oropharynx is clear. Uvula midline. No oropharyngeal exudate or posterior oropharyngeal erythema.     Tonsils: No tonsillar exudate.  Eyes:     General: Lids are normal. Vision grossly intact.        Right eye: No discharge.        Left eye: No discharge.     Extraocular Movements: Extraocular movements intact.     Conjunctiva/sclera: Conjunctivae normal.     Pupils: Pupils are equal, round, and reactive to light.  Neck:     Trachea: Trachea and phonation normal.     Meningeal: Brudzinski's sign and Kernig's sign absent.  Cardiovascular:     Rate and Rhythm: Normal rate and regular rhythm.     Pulses: Normal pulses.     Heart sounds: Normal heart sounds. No murmur heard. Pulmonary:     Effort: Pulmonary effort is normal. No tachypnea, bradypnea, accessory muscle usage, prolonged expiration or respiratory distress.     Breath sounds: Normal breath sounds. No wheezing or rales.  Chest:     Chest wall: No mass, lacerations, deformity, swelling, tenderness, crepitus or edema.  Abdominal:     General: Bowel sounds are normal. There is no distension.     Palpations: Abdomen is soft.     Tenderness: There is abdominal tenderness in the right lower quadrant. There  is no right CVA tenderness, left CVA tenderness, guarding or rebound. Positive signs include Rovsing's sign, McBurney's sign, psoas sign and obturator sign. Negative signs include Murphy's sign.     Hernia: No hernia is present.  Genitourinary:    Comments: GU exam deferred by patient Musculoskeletal:        General: No deformity.     Cervical back: Normal range of motion and neck supple.     Right lower leg: No edema.     Left lower leg: No edema.  Lymphadenopathy:     Cervical: No cervical adenopathy.  Skin:    General: Skin is warm and dry.  Neurological:     Mental Status: She is alert. Mental status is at baseline.  Psychiatric:        Mood and Affect: Mood normal.    ED Results / Procedures / Treatments   Labs (all labs ordered are listed, but only abnormal results are displayed) Labs Reviewed  LIPASE, BLOOD - Abnormal; Notable for the following components:      Result Value   Lipase 61 (*)    All other components within normal limits  COMPREHENSIVE METABOLIC PANEL - Abnormal; Notable for the following components:   Potassium 3.3 (*)    CO2 18 (*)    Glucose, Bld 120 (*)    AST 76 (*)    ALT 89 (*)    All other components within normal limits  URINALYSIS, ROUTINE W REFLEX MICROSCOPIC - Abnormal; Notable for the following components:   APPearance HAZY (*)    All other components within normal limits  CBC  I-STAT BETA HCG BLOOD, ED ([facility], [facility], [facility] ONLY)    EKG None  Radiology CT Abdomen Pelvis W Contrast  Result Date: 10/05/2021 CLINICAL DATA:  Right lower quadrant abdominal pain. EXAM: CT ABDOMEN AND PELVIS WITH CONTRAST TECHNIQUE: Multidetector CT imaging of the abdomen and pelvis was performed using the standard protocol following bolus administration of intravenous contrast. RADIATION DOSE REDUCTION: This exam was performed according to the departmental dose-optimization program which includes automated exposure control, adjustment of the mA and/or kV according to  patient size and/or use of iterative reconstruction technique. CONTRAST:  OMNIPAQUE IOHEXOL 300 MG/ML  SOLN COMPARISON:  None. FINDINGS: Lower chest: No acute abnormality. Hepatobiliary: The liver is mildly enlarged. No focal liver lesions are identified. Gallbladder and bile ducts are within normal limits. Pancreas: Unremarkable. No pancreatic ductal dilatation or surrounding inflammatory changes. Spleen: Normal in size without focal abnormality. Adrenals/Urinary Tract: There is a 3 mm calculus in the inferior pole of the left kidney. There are rounded hypodensities in both kidneys which are too small to characterize, likely cysts. No hydronephrosis or perinephric fluid. Adrenal glands and bladder are within normal limits. Stomach/Bowel: Stomach is within normal limits. Appendix appears normal. No evidence of bowel wall thickening, distention, or inflammatory changes. Vascular/Lymphatic: No significant vascular findings are present. No enlarged abdominal or pelvic lymph nodes. Reproductive: Uterus is surgically absent. Left ovary unremarkable. The right ovary is enlarged measuring 6.9 by 6.0 by 4.7 cm. There is a 4.5 cm cystic area in the ovary. There is no surrounding inflammation. Other: There is trace free fluid in the pelvis. There is no focal abdominal wall hernia or free air. 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 There is 7 mm of anterolisthesis at L5-S1. IMPRESSION: 1. Right ovary is enlarged and contains a 4.5 cm cystic area. Recommend further evaluation with ultrasound. 2. Trace free fluid in the pelvis. 3. Nonobstructing left renal calculus. 4. Mild hepatomegaly. Electronically Signed   By: Darliss Cheney M.D.   On: 10/05/2021 21:42    Procedures Procedures    Medications Ordered in ED Medications  morphine (PF) 4 MG/ML injection 4 mg (4 mg Intravenous Given 10/05/21 2056)  ondansetron (ZOFRAN) injection 4 mg (4 mg Intravenous Given 10/05/21 2055)  iohexol  (OMNIPAQUE) 300 MG/ML solution 100 mL (100 mLs Intravenous Contrast Given 10/05/21 2130)  morphine (PF) 2 MG/ML injection 2 mg (2 mg Intravenous Given 10/05/21 2253)    ED Course/ Medical Decision Making/ A&P                           Medical  Decision Making 36 year old female with right lower quadrant pain that started today.  Hypertensive on intake vitals otherwise normal.  Cardiopulmonary exam is normal, abdominal exam with tenderness at McBurney's point, positive Rovsing sign, positive obturator and psoas signs.  No rebound tenderness.  GU exam deferred by patient.  No CVAT.  The differential diagnosis for RLQ includes but is not limited to:  Cholelithiasis / choledocholithiasis / cholecystitis / cholangitis, hepatitis (eg. viral, alcoholic, toxic),liver abscess, pancreatitis, liver / pancreatic / biliary tract cancer, ischemic hepatopathy (shock liver), hepatic vein obstruction (Budd-Chiari syndrome), liver cell adenoma, peptic ulcer disease (duodenal), functional or nonulcer dyspepsia, right lower lobe pneumonia, pyelonephritis, urinary calculi,  Fitz-Hugh-Curtis syndrome (with pelvic inflammatory disease), herpes zoster, trauma or musculoskeletal pain, herniated disk, abdominal abscess, intestinal ischemia, physical or sexual abuse, ectopic pregnancy, IUP (however patient without uterus), Mittelschmerz, ovarian cyst/torsion, threatened/ievitable abortion, PID, endometriosis, molar pregnancy, heterotopic pregnancy, corpus luteum cyst, appendicitis, UTI/renal colic, IBD.    Amount and/or Complexity of Data Reviewed Labs: ordered.    Details: CBC without leukocytosis or anemia.  CMP with mild hypokalemia of 3.3.  Transaminitis with AST/ALT 76/89, however improved from 1 month ago.  Being trended by her PCP.  Urine without evidence of infection.  Lipase mildly elevated to 61. Radiology: ordered.    Details: CT images visualized by this provider.  Right ovary is enlarged with 4.5 cm cystic area with  trace free fluid in the pelvis.  Nonobstructing left renal calculus and mild pedal megaly. ECG/medicine tests: ordered.  Risk Prescription drug management.   Care decision made with patient regarding role of ultrasonography in the work-up of her presentation today.  She is amenable to plan for transvaginal ultrasound with Doppler.  At time of shift change patient is awaiting this ultrasound.  Care of this patient was signed out to oncoming ED provider Harvie Heck, PA-C.  All pertinent HPI and physical exam, laboratory findings were discussed with her prior to my departure.  Leighana voiced understanding of her medical evaluation and treatment plan.  Each of her questions was answered to her expressed satisfaction.   This chart was dictated using voice recognition software, Dragon. Despite the best efforts of this provider to proofread and correct errors, errors may still occur which can change documentation meaning.   Final Clinical Impression(s) / ED Diagnoses Final diagnoses:  Pelvic pain    Rx / DC Orders ED Discharge Orders     None         Paris Lore, PA-C 10/06/21 0003    Charlynne Pander, MD 10/06/21 [phone]

## 2021-10-05 NOTE — ED Provider Notes (Signed)
23:45: Assumed care of patient @ change of shift pending [place] and disposition.  ? ?Please see prior provider note for full H&P.  Briefly patient is a 36 year old female who presented to the emergency department with complaints of right lower quadrant abdominal pain that began earlier today.  No concern for STI per prior provider team conversation with the patient pelvic exam was deferred and there was no significant suspicion for PID.  ? ?Labs reviewed, mildly elevated LFTs improving from prior, minimally elevated lipase, mild hypokalemia, no significant electrolyte derangement.  No anemia.  Pregnancy test negative. ? ?CT imaging led to ultrasound ? ?[place]:  ?Complex 4.4 cm right ovarian cystic lesion. Given the multiple  ?septations, surgical evaluation is recommended.  ? ? ?00:53: CONSULT: Discussed with physician on call for [facility], recommends outpatient gynecology follow up. Appreciate consult.  ? ?On reassessment patient is feeling improved.  She overall seems reasonable for discharge at this time.  Will provide analgesics. [place] Controlled Substance reporting System queried.  Close outpatient OB/GYN follow-up. I discussed results, treatment plan, need for follow-up, and return precautions with the patient. Provided opportunity for questions, patient confirmed understanding and is in agreement with plan.  ? ? ? ? ? ?  ?Cherly Anderson, [place] ?10/06/21 7622 ? ?  ?Glendora Score, MD ?10/06/21 [phone] ? ?

## 2021-10-05 NOTE — ED Triage Notes (Signed)
Pt reports lower right quad abdominal pain since noon today.   ?

## 2021-10-06 ENCOUNTER — Emergency Department (HOSPITAL_COMMUNITY): Payer: BLUE CROSS/BLUE SHIELD

## 2021-10-06 MED ORDER — OXYCODONE-ACETAMINOPHEN 5-325 MG PO TABS
1.0000 | ORAL_TABLET | Freq: Once | ORAL | Status: AC
  Administered 2021-10-06: 1 via ORAL
  Filled 2021-10-06: qty 1

## 2021-10-06 MED ORDER — NAPROXEN 500 MG PO TABS: 500.0000 mg | ORAL_TABLET | Freq: Two times a day (BID) | ORAL | 0 refills | Status: AC | PRN

## 2021-10-06 MED ORDER — HYDROCODONE-ACETAMINOPHEN 5-325 MG PO TABS: 1.0000 | ORAL_TABLET | ORAL | 0 refills | Status: DC | PRN

## 2021-10-06 MED ORDER — KETOROLAC TROMETHAMINE 15 MG/ML IJ SOLN
15.0000 mg | Freq: Once | INTRAMUSCULAR | Status: AC
  Administered 2021-10-06: 15 mg via INTRAVENOUS
  Filled 2021-10-06: qty 1

## 2021-10-06 NOTE — Discharge Instructions (Addendum)
You were seen in the emergency department today for abdominal pain.  Your imaging showed that you have a 4.4 cm right-sided ovarian cystic structure which we suspect is causing your pain.  We are sending home with the following medicines to help with pain, ? ?- Naproxen- this is a nonsteroidal anti-inflammatory medication that will help with pain and swelling. Be sure to take this medication as prescribed with food, 1 pill every 12 hours,  It should be taken with food, as it can cause stomach upset, and more seriously, stomach bleeding. Do not take other nonsteroidal anti-inflammatory medications with this such as Advil, Motrin, Aleve, Mobic, Goodie Powder, or Motrin etc..   ? ?- Norco-this is a narcotic/controlled substance medication that has potential addicting qualities.  We recommend that you take 1-2 tablets every 6 hours as needed for severe pain.  Do not drive or operate heavy machinery when taking this medicine as it can be sedating. Do not drink alcohol or take other sedating medications when taking this medicine for safety reasons.  Keep this out of reach of small children.  Please be aware this medicine has Tylenol in it (325 mg/tab) do not exceed the maximum dose of Tylenol in a day per over the counter recommendations should you decide to supplement with Tylenol over the counter.  ? ? ?We have prescribed you new medication(s) today. Discuss the medications prescribed today with your pharmacist as they can have adverse effects and interactions with your other medicines including over the counter and prescribed medications. Seek medical evaluation if you start to experience new or abnormal symptoms after taking one of these medicines, seek care immediately if you start to experience difficulty breathing, feeling of your throat closing, facial swelling, or rash as these could be indications of a more serious allergic reaction ? ?We would like you to follow-up with your OB/GYN as soon as possible, we have  also provided our OB/GYN on-call information for follow-up.  Your liver function tests are mildly elevated and potassium was mildly low, please include potassium rich foods in your diet and have these rechecked by your primary care provider.  Return to the emergency department for new or worsening symptoms including but not limited to new or worsening pain, fever, inability to keep fluids down, passing out, or any other concerns. ? ? ?

## 2021-10-20 ENCOUNTER — Other Ambulatory Visit: Payer: Self-pay | Admitting: Neurology

## 2021-12-18 ENCOUNTER — Other Ambulatory Visit: Payer: Self-pay | Admitting: Neurology

## 2022-02-08 ENCOUNTER — Other Ambulatory Visit: Payer: Self-pay | Admitting: Neurology

## 2022-03-28 IMAGING — CT CT ABD-PELV W/ CM
2 of 4 series · 16 of 46 positions shown, 18 images · IV contrast (Omni 300)
Comparison: None.

CLINICAL DATA: Right lower quadrant abdominal pain.

EXAM:
CT ABDOMEN AND PELVIS WITH CONTRAST
TECHNIQUE: Multidetector CT imaging of the abdomen and pelvis was performed
using the standard protocol following bolus administration of
intravenous contrast.

[Series 3: a/p w/ 5mm · axial · 0.98mm/px · z∈[+678,+1123]mm · 13 of 99 slices shown, 15 images]
[im 5/99  soft-tissue]
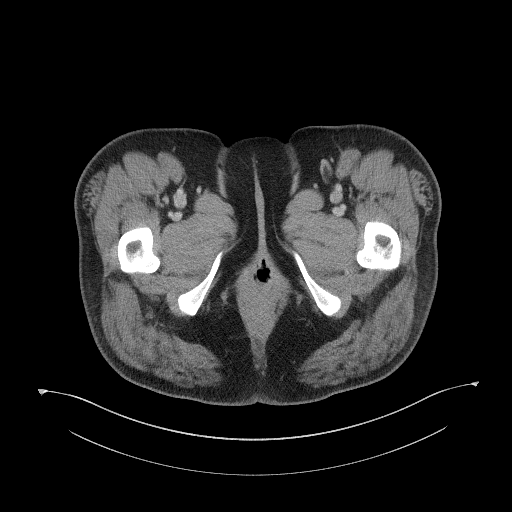
[im 5/99  bone]
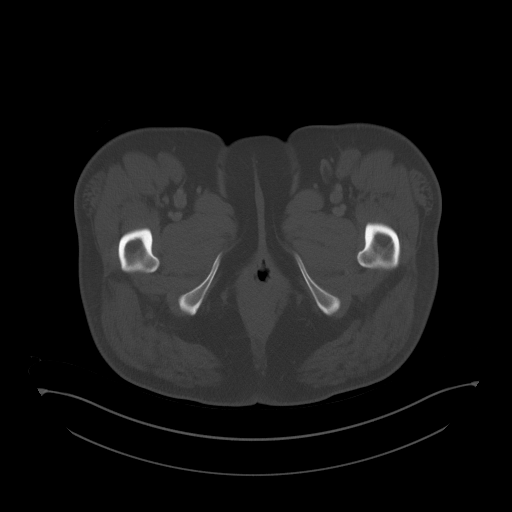
[im 14/99  soft-tissue]
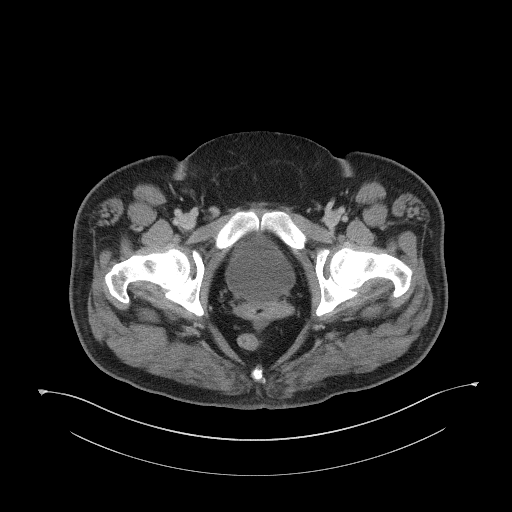
[im 23/99  soft-tissue]
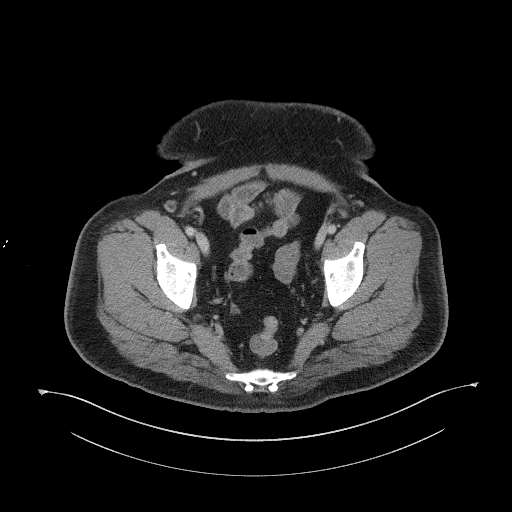
[im 27/99  soft-tissue]
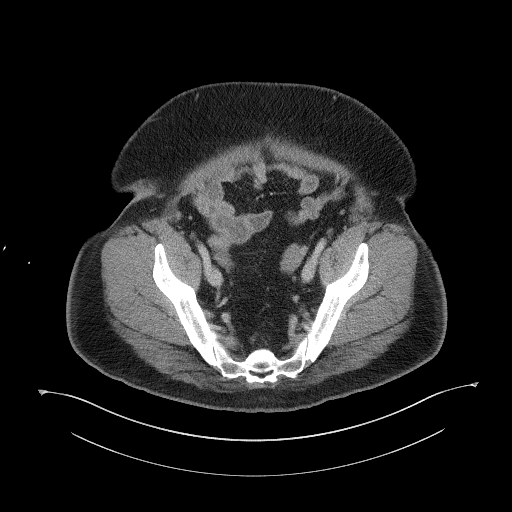
[im 36/99  soft-tissue]
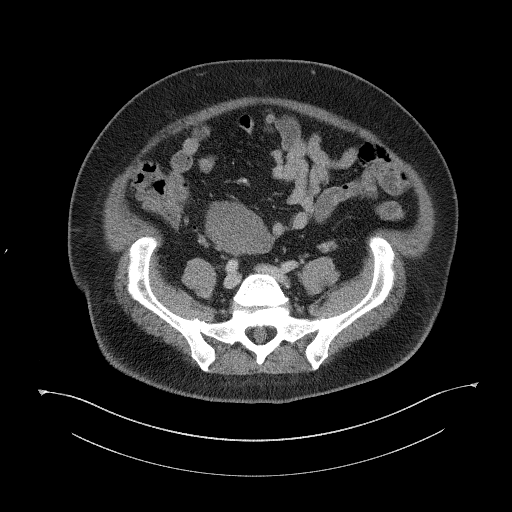
[im 41/99  soft-tissue]
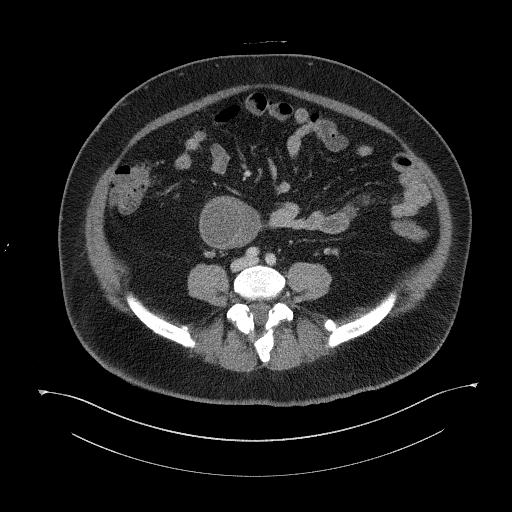
[im 49/99  soft-tissue]
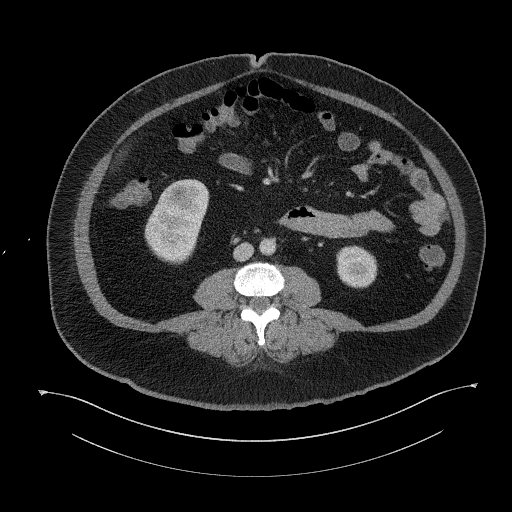
[im 58/99  soft-tissue]
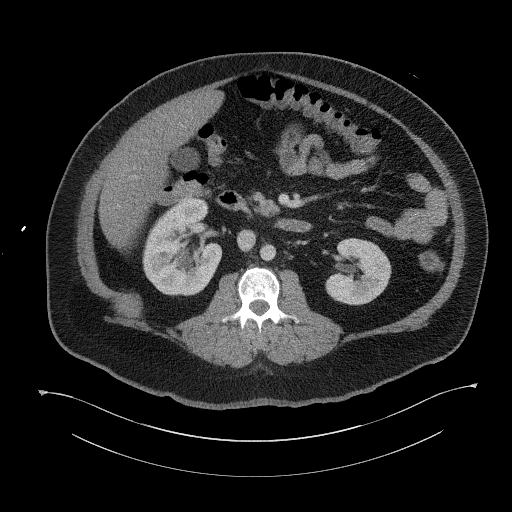
[im 63/99  soft-tissue]
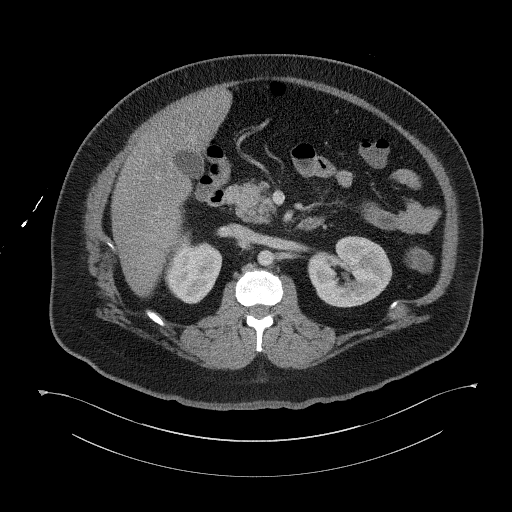
[im 63/99  bone]
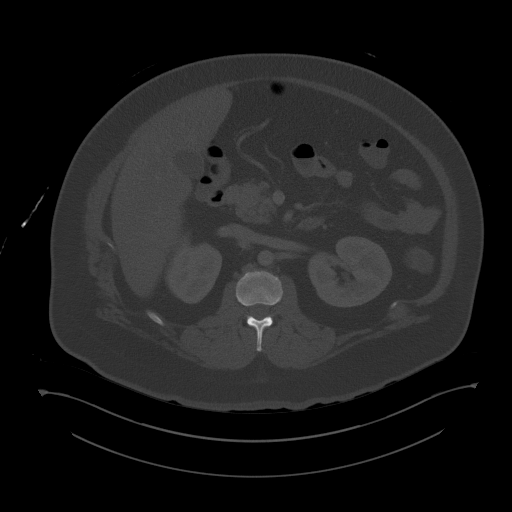
[im 72/99  soft-tissue]
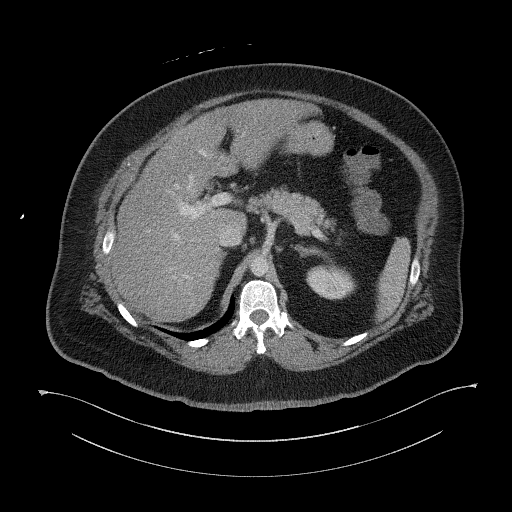
[im 76/99  soft-tissue]
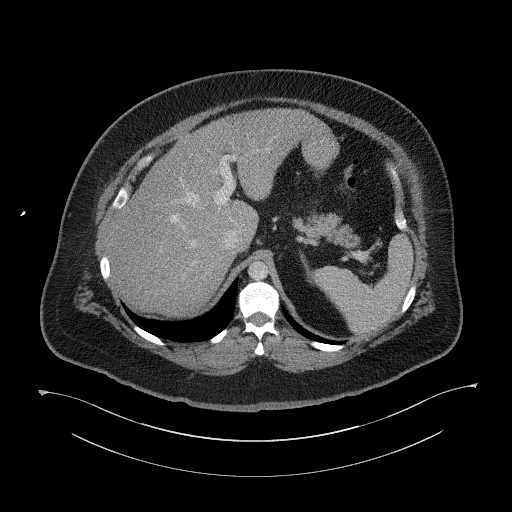
[im 85/99  soft-tissue]
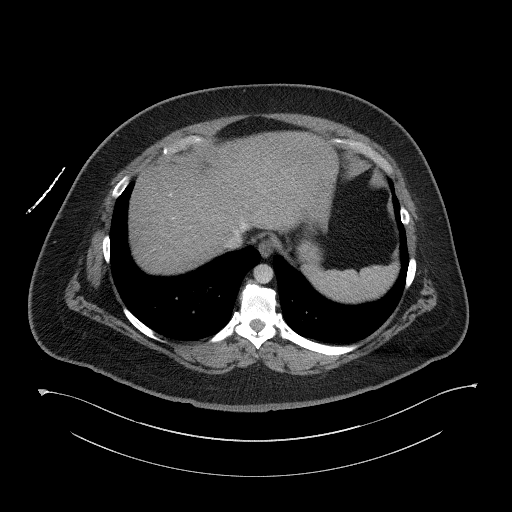
[im 94/99  soft-tissue]
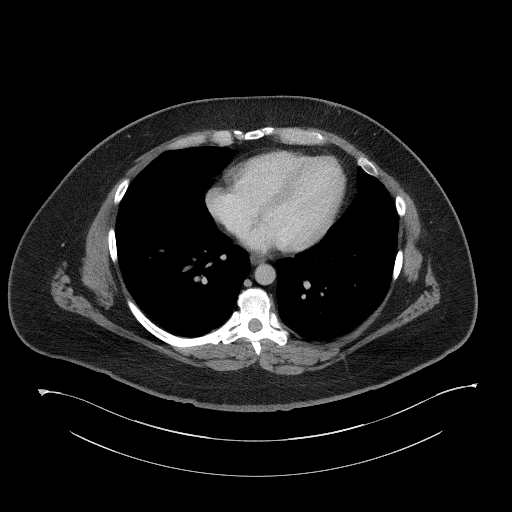

[Series 6: a/p w/ cor · coronal · 0.96mm/px · 3 of 196 slices shown]
[im 66/196  soft-tissue]
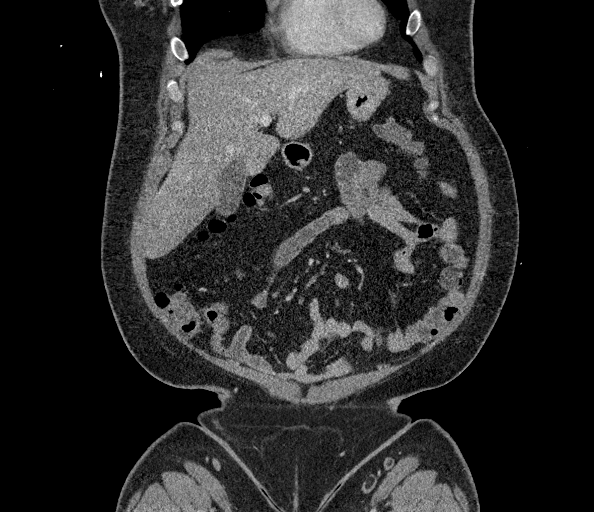
[im 87/196  soft-tissue]
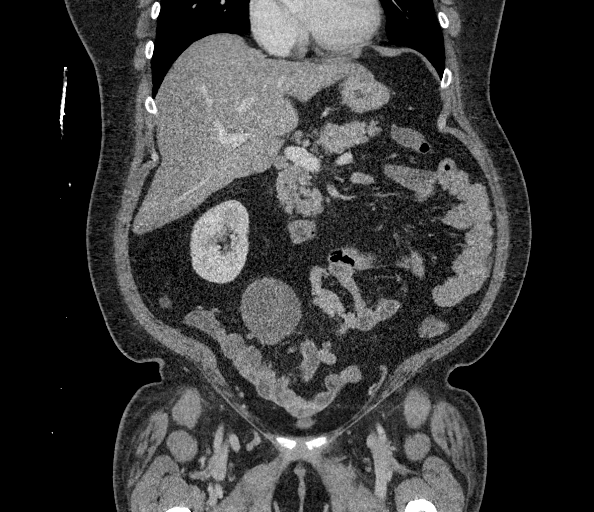
[im 109/196  soft-tissue]
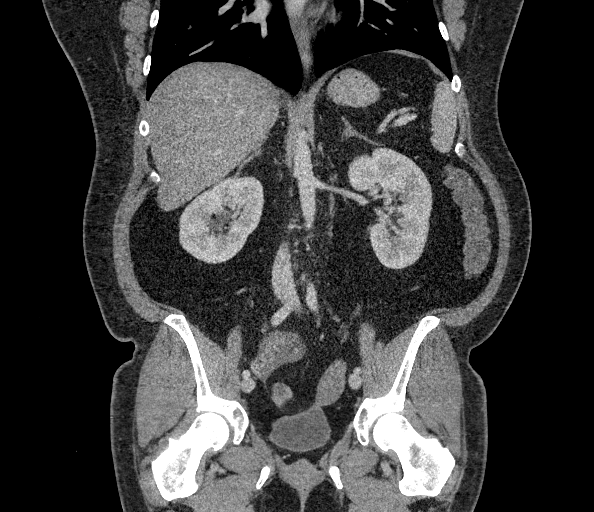

[16 of 46 positions shown; findings below may reference images not displayed]

RADIATION DOSE REDUCTION: This exam was performed according to the
departmental dose-optimization program which includes automated
exposure control, adjustment of the mA and/or kV according to
patient size and/or use of iterative reconstruction technique.

CONTRAST:  100mL OMNIPAQUE IOHEXOL 300 MG/ML  SOLN
FINDINGS: Lower chest: No acute abnormality.

Hepatobiliary: The liver is mildly enlarged. No focal liver lesions
are identified. Gallbladder and bile ducts are within normal limits.

Pancreas: Unremarkable. No pancreatic ductal dilatation or
surrounding inflammatory changes.

Spleen: Normal in size without focal abnormality.

Adrenals/Urinary Tract: There is a 3 mm calculus in the inferior
pole of the left kidney. There are rounded hypodensities in both
kidneys which are too small to characterize, likely cysts. No
hydronephrosis or perinephric fluid. Adrenal glands and bladder are
within normal limits.

Stomach/Bowel: Stomach is within normal limits. Appendix appears
normal. No evidence of bowel wall thickening, distention, or
inflammatory changes.

Vascular/Lymphatic: No significant vascular findings are present. No
enlarged abdominal or pelvic lymph nodes.

Reproductive: Uterus is surgically absent. Left ovary unremarkable.
The right ovary is enlarged measuring 6.9 by 6.0 by 4.7 cm. There is
a 4.5 cm cystic area in the ovary. There is no surrounding
inflammation.

Other: There is trace free fluid in the pelvis. There is no focal
abdominal wall hernia or free air.

Musculoskeletal: There are bilateral pars interarticularis defects
at L5. There is 7 mm of anterolisthesis at L5-S1.
IMPRESSION: 1. Right ovary is enlarged and contains a 4.5 cm cystic area.
Recommend further evaluation with ultrasound.
2. Trace free fluid in the pelvis.
3. Nonobstructing left renal calculus.
4. Mild hepatomegaly.

## 2022-03-29 IMAGING — US US PELVIS COMPLETE TRANSABD/TRANSVAG W DUPLEX AND/OR DOPPLER
1 series · 13 of 25 positions shown · non-contrast
Comparison: CT from the previous day.

CLINICAL DATA: Pelvic pain with enlarged right ovary with cystic
change.

EXAM:
TRANSABDOMINAL AND TRANSVAGINAL ULTRASOUND OF PELVIS
DOPPLER ULTRASOUND OF OVARIES
TECHNIQUE: Both transabdominal and transvaginal ultrasound examinations of the
pelvis were performed. Transabdominal technique was performed for
global imaging of the pelvis including uterus, ovaries, adnexal
regions, and pelvic cul-de-sac.
It was necessary to proceed with endovaginal exam following the
transabdominal exam to visualize the ovaries. Color and duplex
Doppler ultrasound was utilized to evaluate blood flow to the
ovaries.

[Series 1: us pelvic complete w transvaginal and torsion righ · 29 acquisitions, 13 frames shown]
[im 1/29]
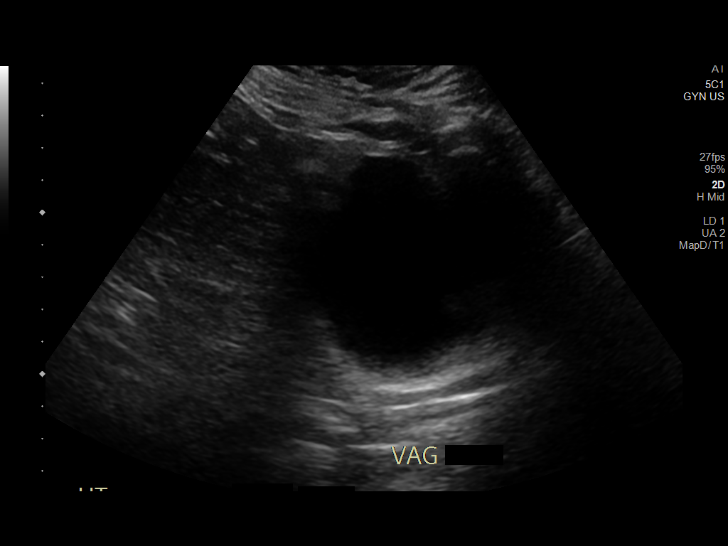
[im 3/29]
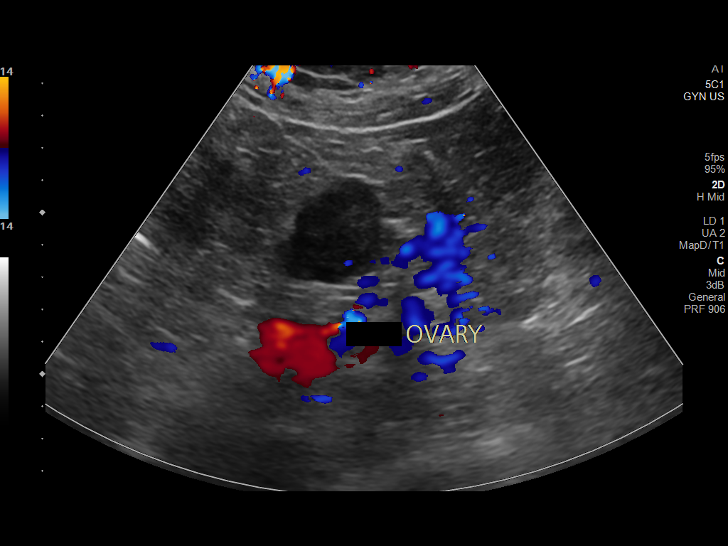
[im 5/29]
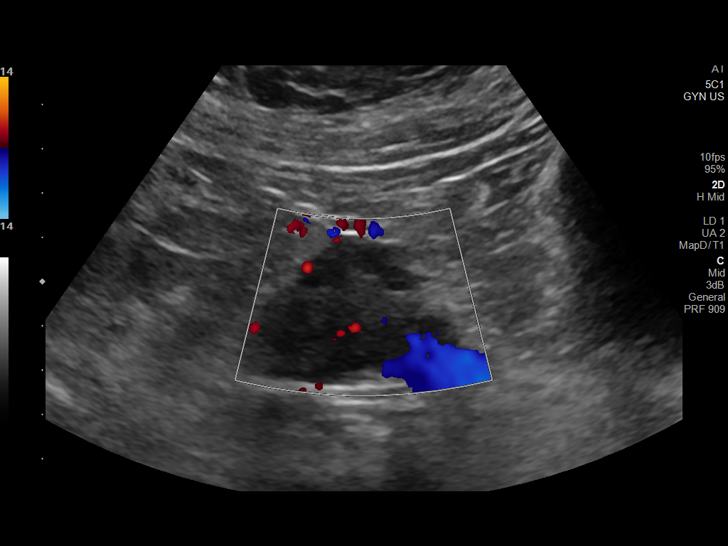
[im 8/29]
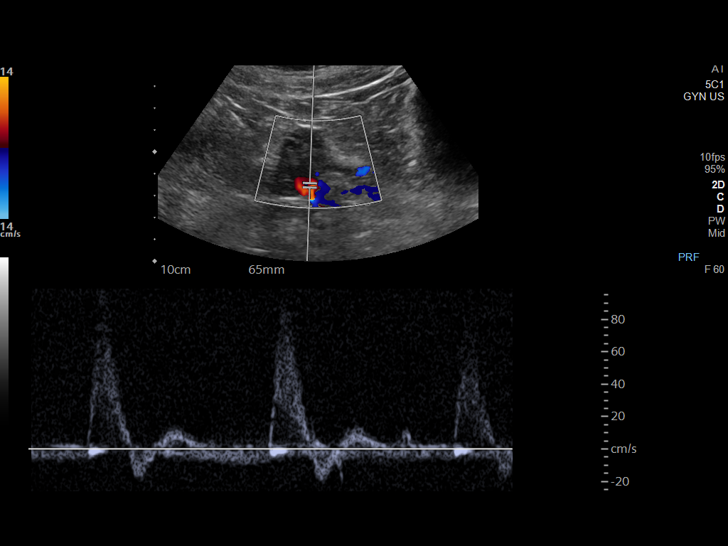
[im 10/29]
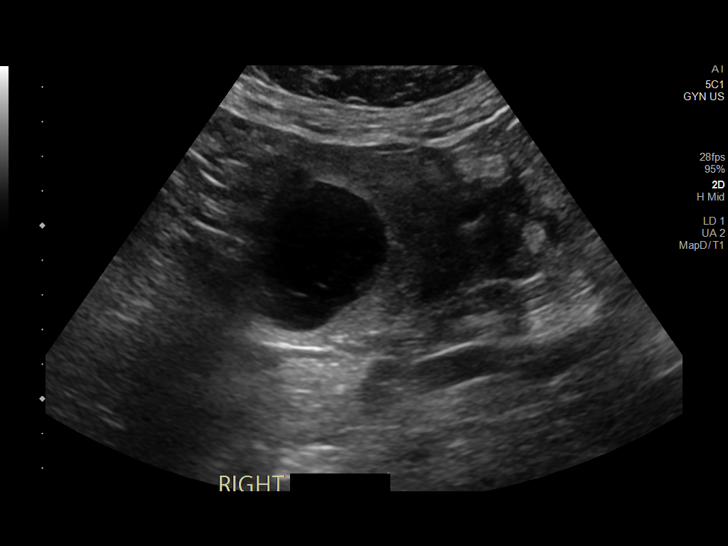
[im 12/29]
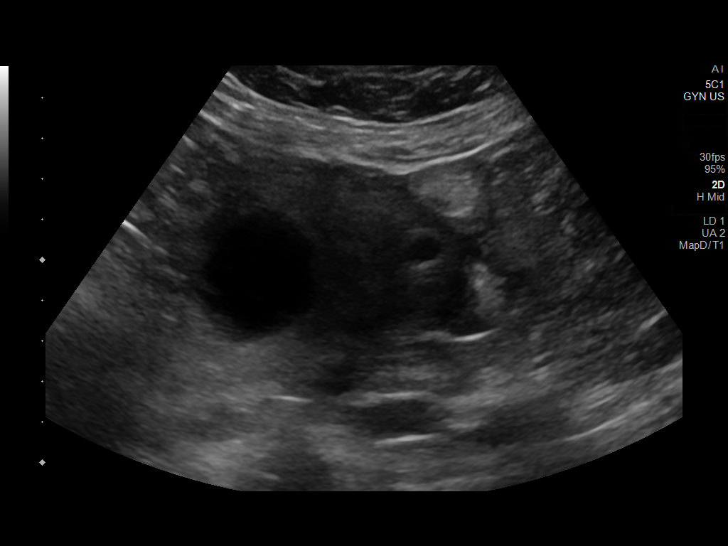
[im 15/29]
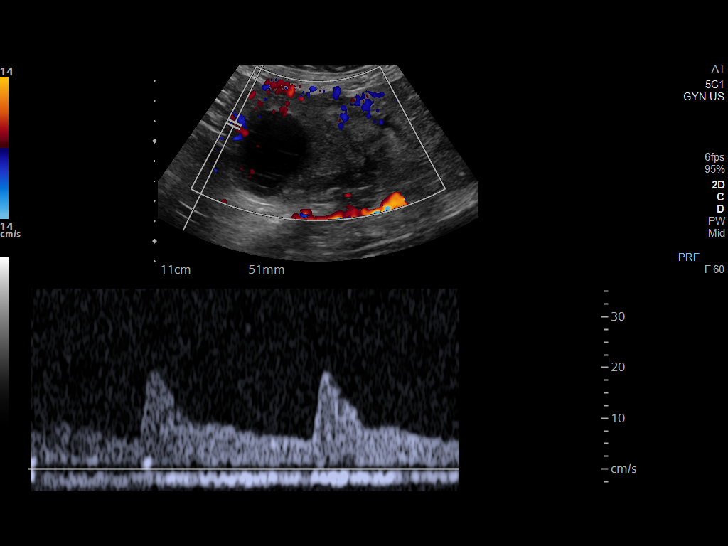
[im 17/29]
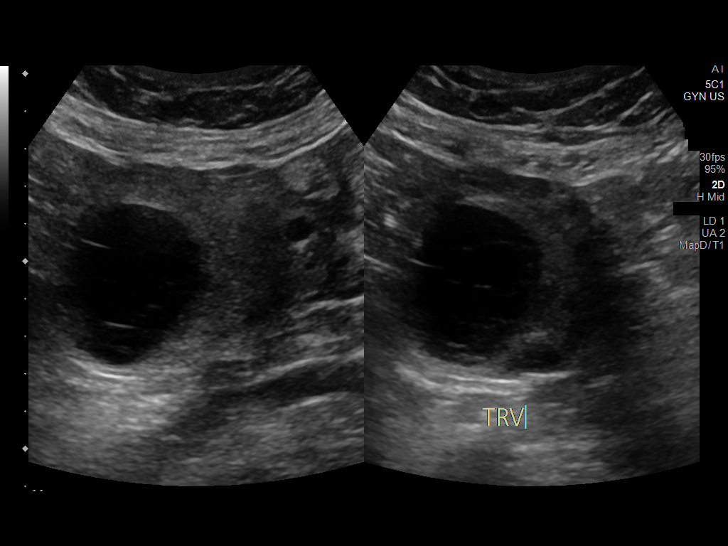
[im 19/29]
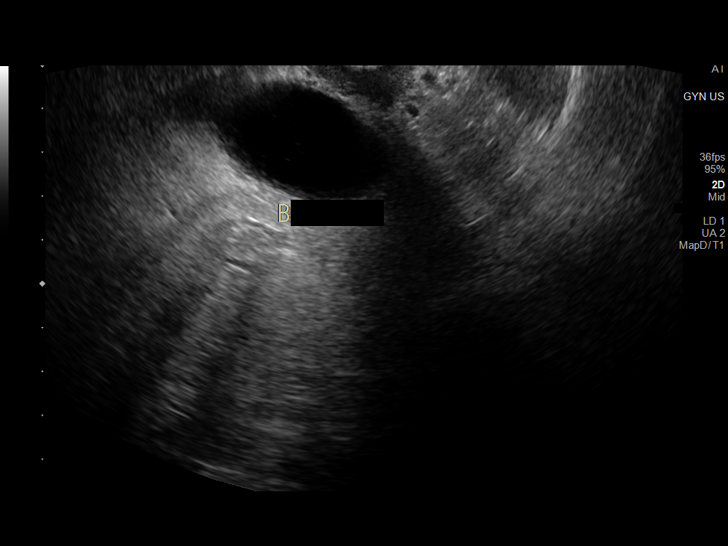
[im 22/29]
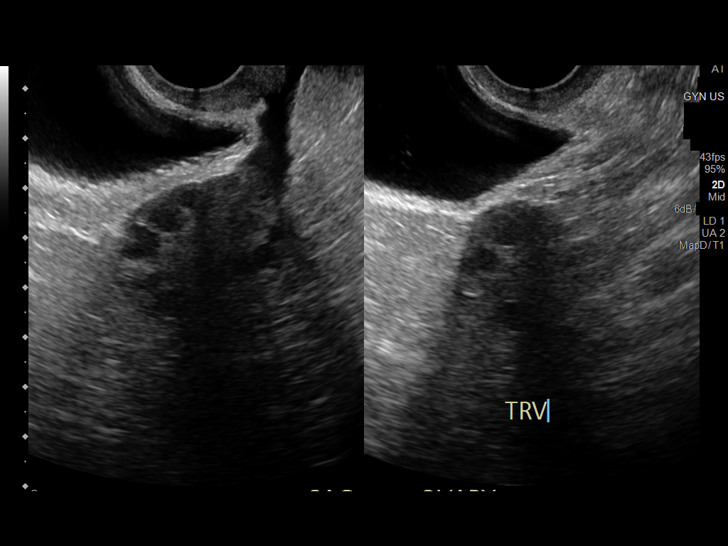
[im 24/29]
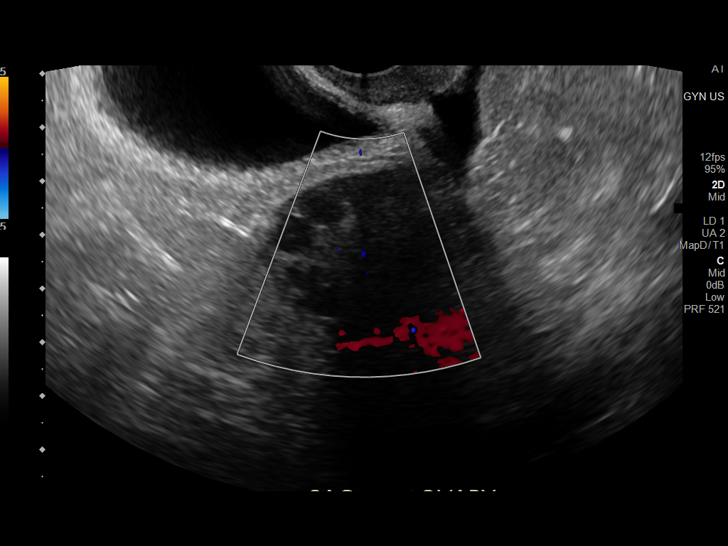
[im 26/29]
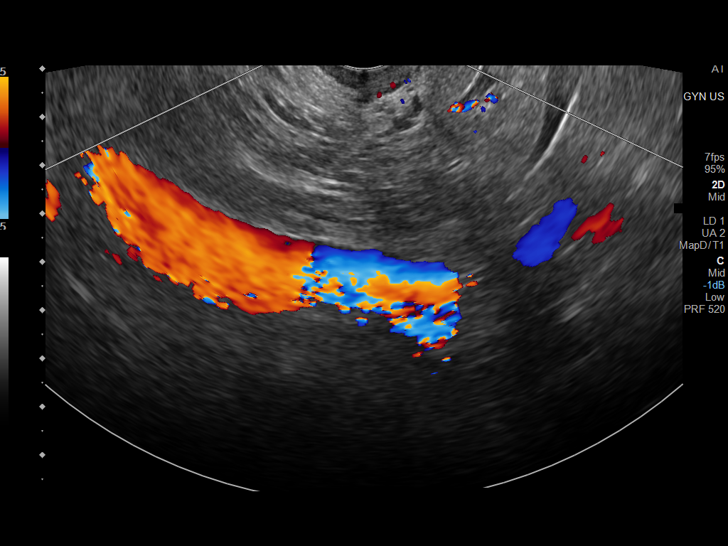
[im 29/29]
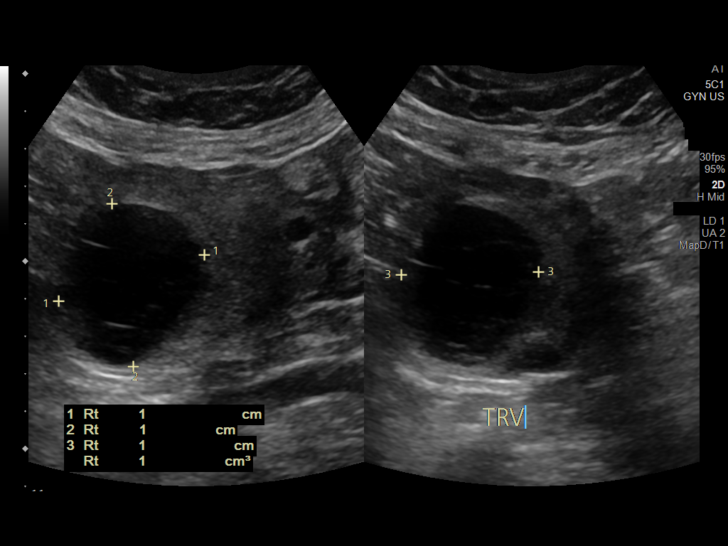

[13 of 25 positions shown; findings below may reference images not displayed]

FINDINGS: Uterus

Surgically removed

Right ovary

Measurements: 7.8 x 5.3 x 6.2 cm. = volume: 133.4 mL. Complex cystic
lesion is noted measuring up to 4.4 cm. Multiple thin septations are
identified. This corresponds with that seen on recent CT
examination.

Left ovary

Measurements: 4.0 x 2.2 x 2.4 cm. = volume: 11.1 mL. Normal
appearance/no adnexal mass.

Pulsed Doppler evaluation of both ovaries demonstrates normal
low-resistance arterial and venous waveforms.

Other findings

Minimal free fluid is noted likely physiologic in nature.
IMPRESSION: Complex 4.4 cm right ovarian cystic lesion. Given the multiple
septations, surgical evaluation is recommended.

No other focal abnormality is noted.

Status post hysterectomy.

## 2022-08-04 ENCOUNTER — Emergency Department (HOSPITAL_COMMUNITY)
Admission: EM | Admit: 2022-08-04 | Discharge: 2022-08-05 | Disposition: A | Discharge status: Home / Self Care | Payer: BLUE CROSS/BLUE SHIELD | Attending: Emergency Medicine | Admitting: Emergency Medicine

## 2022-08-04 ENCOUNTER — Encounter (HOSPITAL_COMMUNITY): Payer: Self-pay | Admitting: *Deleted

## 2022-08-04 ENCOUNTER — Other Ambulatory Visit: Payer: Self-pay

## 2022-08-04 DIAGNOSIS — E119 Type 2 diabetes mellitus without complications: Secondary | ICD-10-CM | POA: Insufficient documentation

## 2022-08-04 DIAGNOSIS — I10 Essential (primary) hypertension: Secondary | ICD-10-CM | POA: Diagnosis not present

## 2022-08-04 DIAGNOSIS — G971 Other reaction to spinal and lumbar puncture: Secondary | ICD-10-CM | POA: Insufficient documentation

## 2022-08-04 DIAGNOSIS — R519 Headache, unspecified: Secondary | ICD-10-CM | POA: Diagnosis present

## 2022-08-04 DIAGNOSIS — F17210 Nicotine dependence, cigarettes, uncomplicated: Secondary | ICD-10-CM | POA: Insufficient documentation

## 2022-08-04 NOTE — ED Triage Notes (Signed)
Pt had a LP on Wednesday and developed a spinal headache Friday.  Pt continues to have a HA 8/10 and her neurologist advised her to come here for a blood patch.

## 2022-08-05 MED ORDER — PROMETHAZINE HCL 25 MG/ML IJ SOLN
25.0000 mg | Freq: Once | INTRAMUSCULAR | Status: AC
  Administered 2022-08-05: 25 mg via INTRAMUSCULAR
  Filled 2022-08-05: qty 1

## 2022-08-05 NOTE — ED Notes (Signed)
Patient states LP one week ago to check for intercranial hypertension, patient states she developed a headache 12/29. Patient states she continues to have a headache this visit and her neurologist suggested she comes here for evaluation. Patient is A&OX4, ambulatory without deficit from waiting room. Resting in bed with visitor at bedside

## 2022-08-05 NOTE — ED Notes (Signed)
Patient verbalizes understanding of discharge instructions, home care and follow up care. Patient ambulatory out of department with family at this time.

## 2022-08-05 NOTE — ED Provider Notes (Signed)
AP-EMERGENCY DEPT [facility] Emergency Department Provider Note MRN:  [id number]  Arrival date & time: 08/05/22     Chief Complaint   Headache   History of Present Illness   [name] is a 37 y.o. year-old female with a history of diabetes, migraines presenting to the ED with chief complaint of headache.  Patient has a long history of headaches.  She explains that her neurologist thinks they are due to intracranial hypertension.  She underwent a lumbar puncture on 12-29 for this indication.  Since then her headache has changed and has been more debilitating.  It is much improved when laying down, but after an hour of standing up the pain is really bad and she has to lay back down.  Denies numbness or weakness to the arms or legs, no bowel or bladder dysfunction, no fever.  Review of Systems  A thorough review of systems was obtained and all systems are negative except as noted in the HPI and PMH.   Patient's Health History    Past Medical History:  Diagnosis Date   Abnormal Pap smear    Anxiety    Depression    Diabetes mellitus without complication (HCC)    GERD (gastroesophageal reflux disease)    pregnancy related indigestion-tums prn   Gonorrhea    Headache(784.0)    HPV (human papilloma virus) infection    age 41   Hx of scarlet fever 08/03/1994   Migraines    PVC (premature ventricular contraction)    history of pvc's-started in early preg 5/13 saw [place] -told to reduce caffeine intake-pvc's have stopped    Past Surgical History:  Procedure Laterality Date   CESAREAN SECTION  2008, 2009   TUBAL LIGATION     WISDOM TOOTH EXTRACTION      Family History  Problem Relation Age of Onset   Heart disease Mother    Heart murmur Mother    Heart disease Father    Hypertension Father    Heart attack Father        twice   Liver cancer Father    Lung cancer Father        stage 4   Cancer Father        lung and liver, dx 12/2011   Asthma Daughter     Asthma Son    Anesthesia problems Neg Hx    Hypotension Neg Hx    Malignant hyperthermia Neg Hx    Pseudochol deficiency Neg Hx    Hearing loss Neg Hx     Social History   Socioeconomic History   Marital status: Married    Spouse name: Not on file   Number of children: 2   Years of education: Not on file   Highest education level: Not on file  Occupational History   Occupation: stay at home mother  Tobacco Use   Smoking status: Every Day    Packs/day: 0.25    Years: 8.00    Total pack years: 2.00    Types: Cigarettes   Smokeless tobacco: Never   Tobacco comments:    2 cig a day  Vaping Use   Vaping Use: Never used  Substance and Sexual Activity   Alcohol use: Yes    Comment: occ   Drug use: No   Sexual activity: Yes    Birth control/protection: Surgical  Other Topics Concern   Not on file  Social History Narrative   Not on file   Social Determinants of Health  Financial Resource Strain: Not on file  Food Insecurity: Not on file  Transportation Needs: Not on file  Physical Activity: Not on file  Stress: Not on file  Social Connections: Not on file  Intimate Partner Violence: Not on file     Physical Exam   Vitals:   08/05/22 0045 08/05/22 0050  BP: 135/75   Pulse: 85   Resp: 18   Temp: 97.9 F (36.6 C) 98.3 F (36.8 C)  SpO2: 97%     CONSTITUTIONAL: Well-appearing, NAD NEURO/PSYCH:  Alert and oriented x 3, no focal deficits EYES:  eyes equal and reactive ENT/NECK:  no LAD, no JVD CARDIO: Regular rate, well-perfused, normal S1 and S2 PULM:  CTAB no wheezing or rhonchi GI/GU:  non-distended, non-tender MSK/SPINE:  No gross deformities, no edema SKIN:  no rash, atraumatic   *Additional and/or pertinent findings included in MDM below  Diagnostic and Interventional Summary    EKG Interpretation  Date/Time:    Ventricular Rate:    PR Interval:    QRS Duration:   QT Interval:    QTC Calculation:   R Axis:     Text Interpretation:          Labs Reviewed - No data to display  No orders to display    Medications  promethazine (PHENERGAN) injection 25 mg (25 mg Intramuscular Given 08/05/22 0158)     Procedures  /  Critical Care Procedures  ED Course and Medical Decision Making  Initial Impression and Ddx The history and description of the headache is very consistent with a post LP headache.  Vital signs normal, no neurological concerns on exam.  A bit unfortunate that she was advised to come to this emergency department/hospital, I do not think that blood patches are performed here, especially not facilitated through the emergency department.  There is no indication for further testing or admission, we will try to help her get the follow-up she needs.  Past medical/surgical history that increases complexity of ED encounter: Intracranial hypertension  Interpretation of Diagnostics Laboratory and/or imaging options to aid in the diagnosis/care of the patient were considered.  After careful history and physical examination, it was determined that there was no indication for diagnostics at this time.  Patient Reassessment and Ultimate Disposition/Management     Discharge  Patient management required discussion with the following services or consulting groups:  None  Complexity of Problems Addressed Acute illness or injury that poses threat of life of bodily function  Additional Data Reviewed and Analyzed Further history obtained from: Further history from spouse/family member  Additional Factors Impacting ED Encounter Risk None  Barth Kirks. Sedonia Small, [place] [email or web address]  Final Clinical Impressions(s) / ED Diagnoses     ICD-10-CM   1. Headache after spinal puncture  G97.1       ED Discharge Orders     None        Discharge Instructions Discussed with and Provided to Patient:    Discharge Instructions      You were evaluated in the Emergency  Department and after careful evaluation, we did not find any emergent condition requiring admission or further testing in the hospital.  Your exam/testing today is overall reassuring.  Your headache seems to be related to your recent lumbar puncture.  Recommend follow-up with your neurologist to assist you in scheduling a blood patch.  If you wish to obtain a blood patch through the [facility] system, recommend calling the  number [phone] to see if you can get an appointment.  Please return to the Emergency Department if you experience any worsening of your condition.   Thank you for allowing [place] to be a part of your care.      Maudie Flakes, MD 08/05/22 Reece Agar

## 2022-08-05 NOTE — Discharge Instructions (Signed)
You were evaluated in the Emergency Department and after careful evaluation, we did not find any emergent condition requiring admission or further testing in the hospital.  Your exam/testing today is overall reassuring.  Your headache seems to be related to your recent lumbar puncture.  Recommend follow-up with your neurologist to assist you in scheduling a blood patch.  If you wish to obtain a blood patch through the [facility] system, recommend calling the number [phone] to see if you can get an appointment.  Please return to the Emergency Department if you experience any worsening of your condition.   Thank you for allowing [place] to be a part of your care.

## 2022-12-18 ENCOUNTER — Encounter (HOSPITAL_BASED_OUTPATIENT_CLINIC_OR_DEPARTMENT_OTHER): Payer: Self-pay | Admitting: *Deleted

## 2022-12-18 ENCOUNTER — Other Ambulatory Visit: Payer: Self-pay

## 2022-12-18 ENCOUNTER — Emergency Department (HOSPITAL_BASED_OUTPATIENT_CLINIC_OR_DEPARTMENT_OTHER)
Admission: EM | Admit: 2022-12-18 | Discharge: 2022-12-18 | Disposition: A | Discharge status: Home / Self Care | Payer: 59 | Attending: Emergency Medicine | Admitting: Emergency Medicine

## 2022-12-18 ENCOUNTER — Emergency Department (HOSPITAL_BASED_OUTPATIENT_CLINIC_OR_DEPARTMENT_OTHER): Payer: 59

## 2022-12-18 DIAGNOSIS — L0291 Cutaneous abscess, unspecified: Secondary | ICD-10-CM

## 2022-12-18 DIAGNOSIS — L02214 Cutaneous abscess of groin: Secondary | ICD-10-CM | POA: Insufficient documentation

## 2022-12-18 DIAGNOSIS — E1165 Type 2 diabetes mellitus with hyperglycemia: Secondary | ICD-10-CM | POA: Diagnosis not present

## 2022-12-18 DIAGNOSIS — M7989 Other specified soft tissue disorders: Secondary | ICD-10-CM | POA: Diagnosis not present

## 2022-12-18 DIAGNOSIS — Z7984 Long term (current) use of oral hypoglycemic drugs: Secondary | ICD-10-CM | POA: Insufficient documentation

## 2022-12-18 DIAGNOSIS — N764 Abscess of vulva: Secondary | ICD-10-CM | POA: Insufficient documentation

## 2022-12-18 DIAGNOSIS — Z794 Long term (current) use of insulin: Secondary | ICD-10-CM | POA: Insufficient documentation

## 2022-12-18 DIAGNOSIS — Z872 Personal history of diseases of the skin and subcutaneous tissue: Secondary | ICD-10-CM

## 2022-12-18 DIAGNOSIS — Z23 Encounter for immunization: Secondary | ICD-10-CM | POA: Insufficient documentation

## 2022-12-18 DIAGNOSIS — L732 Hidradenitis suppurativa: Secondary | ICD-10-CM | POA: Diagnosis not present

## 2022-12-18 LAB — CBC WITH DIFFERENTIAL/PLATELET
Abs Immature Granulocytes: 0.06 10*3/uL (ref 0.00–0.07)
Basophils Absolute: 0 10*3/uL (ref 0.0–0.1)
Basophils Relative: 0 %
Eosinophils Absolute: 0.2 10*3/uL (ref 0.0–0.5)
Eosinophils Relative: 3 %
HCT: 37.2 % (ref 36.0–46.0)
Hemoglobin: 12.6 g/dL (ref 12.0–15.0)
Immature Granulocytes: 1 %
Lymphocytes Relative: 25 %
Lymphs Abs: 2.4 10*3/uL (ref 0.7–4.0)
MCH: 30.7 pg (ref 26.0–34.0)
MCHC: 33.9 g/dL (ref 30.0–36.0)
MCV: 90.5 fL (ref 80.0–100.0)
Monocytes Absolute: 0.6 10*3/uL (ref 0.1–1.0)
Monocytes Relative: 6 %
Neutro Abs: 6.4 10*3/uL (ref 1.7–7.7)
Neutrophils Relative %: 65 %
Platelets: 304 10*3/uL (ref 150–400)
RBC: 4.11 MIL/uL (ref 3.87–5.11)
RDW: 13.2 % (ref 11.5–15.5)
WBC: 9.8 10*3/uL (ref 4.0–10.5)
nRBC: 0 % (ref 0.0–0.2)

## 2022-12-18 LAB — BASIC METABOLIC PANEL
Anion gap: 11 (ref 5–15)
BUN: 11 mg/dL (ref 6–20)
CO2: 21 mmol/L — ABNORMAL LOW (ref 22–32)
Calcium: 8.7 mg/dL — ABNORMAL LOW (ref 8.9–10.3)
Chloride: 108 mmol/L (ref 98–111)
Creatinine, Ser: 0.57 mg/dL (ref 0.44–1.00)
GFR, Estimated: >60 mL/min (ref >60)
Glucose, Bld: 130 mg/dL — ABNORMAL HIGH (ref 70–99)
Potassium: 3.1 mmol/L — ABNORMAL LOW (ref 3.5–5.1)
Sodium: 140 mmol/L (ref 135–145)

## 2022-12-18 LAB — CBG MONITORING, ED: Glucose-Capillary: 157 mg/dL — ABNORMAL HIGH (ref 70–99)

## 2022-12-18 LAB — HCG, SERUM, QUALITATIVE: Preg, Serum: NEGATIVE

## 2022-12-18 MED ORDER — IOHEXOL 300 MG/ML  SOLN
100.0000 mL | Freq: Once | INTRAMUSCULAR | Status: AC | PRN
  Administered 2022-12-18: 100 mL via INTRAVENOUS

## 2022-12-18 MED ORDER — DOXYCYCLINE HYCLATE 100 MG PO TABS
100.0000 mg | ORAL_TABLET | Freq: Once | ORAL | Status: AC
  Administered 2022-12-18: 100 mg via ORAL
  Filled 2022-12-18: qty 1

## 2022-12-18 MED ORDER — DOXYCYCLINE HYCLATE 100 MG PO CAPS: 100.0000 mg | ORAL_CAPSULE | Freq: Two times a day (BID) | ORAL | 0 refills | Status: AC

## 2022-12-18 MED ORDER — LIDOCAINE-EPINEPHRINE-TETRACAINE (LET) TOPICAL GEL
3.0000 mL | Freq: Once | TOPICAL | Status: AC
  Administered 2022-12-18: 3 mL via TOPICAL
  Filled 2022-12-18: qty 3

## 2022-12-18 MED ORDER — LIDOCAINE HCL (PF) 1 % IJ SOLN
5.0000 mL | Freq: Once | INTRAMUSCULAR | Status: AC
  Administered 2022-12-18: 5 mL
  Filled 2022-12-18: qty 5

## 2022-12-18 MED ORDER — TETANUS-DIPHTH-ACELL PERTUSSIS 5-2.5-18.5 LF-MCG/0.5 IM SUSY
0.5000 mL | PREFILLED_SYRINGE | Freq: Once | INTRAMUSCULAR | Status: AC
  Administered 2022-12-18: 0.5 mL via INTRAMUSCULAR
  Filled 2022-12-18: qty 0.5

## 2022-12-18 NOTE — ED Provider Notes (Cosign Needed Addendum)
[place] EMERGENCY DEPARTMENT AT [facility] Provider Note   CSN: [id number] Arrival date & time: 12/18/22  1543     History  Chief Complaint  Patient presents with   Cyst    [name] is a 37 y.o. female history of hidradenitis suppurativa here for evaluation of abscess.  Noted to have 1 on her left suprapubic region as well as her left proximal thigh just inferior to inguinal crease.  States typically she can place a warm compress to the area and her symptoms improve however this 1 has not.  She is not currently followed by dermatology.  Has not been on antibiotics for greater than 5 years.  No fever, nausea, vomiting.  She is diabetic, states her blood sugars have been running "high" into the 200s.  She has had multiple I&D's previously however these wounds feel different.  HPI     Home Medications Prior to Admission medications   Medication Sig Start Date End Date Taking? Authorizing Provider  doxycycline (VIBRAMYCIN) 100 MG capsule Take 1 capsule (100 mg total) by mouth 2 (two) times daily. 12/18/22  Yes Araina Butrick A, PA-C  acetaminophen (TYLENOL) 500 MG tablet Take 1,000 mg by mouth every 6 (six) hours as needed.    [provider]  acetaZOLAMIDE (DIAMOX) 250 MG tablet Take 1 tablet (250 mg total) by mouth 2 (two) times daily. 04/02/21   Dohmeier, Porfirio Mylar, MD  clonazePAM (KLONOPIN) 0.5 MG tablet Take 0.5 mg by mouth at bedtime as needed for anxiety. 06/18/21   [provider]  HYDROcodone-acetaminophen (NORCO/VICODIN) 5-325 MG tablet Take 1-2 tablets by mouth every 4 (four) hours as needed. 10/06/21   Petrucelli, Samantha R, PA-C  ibuprofen (ADVIL,MOTRIN) 200 MG tablet Take 800 mg by mouth every 6 (six) hours as needed for headache or moderate pain.    [provider]  lisinopril (ZESTRIL) 20 MG tablet Take 20 mg by mouth at bedtime.    [provider]  metFORMIN (GLUCOPHAGE) 500 MG tablet Take 1 tablet (500 mg total) by  mouth 2 (two) times daily with a meal. Patient taking differently: Take 1,000 mg by mouth 2 (two) times daily with a meal. 09/07/21   Jacalyn Lefevre, MD  Multiple Vitamin (MULTIVITAMIN WITH MINERALS) TABS tablet Take 1 tablet by mouth daily.    [provider]  naproxen (NAPROSYN) 500 MG tablet Take 1 tablet (500 mg total) by mouth 2 (two) times daily as needed for moderate pain. 10/06/21   Petrucelli, Samantha R, PA-C  omeprazole (PRILOSEC) 40 MG capsule Take 1 capsule by mouth daily.    [provider]  ondansetron (ZOFRAN ODT) 4 MG disintegrating tablet Take 1 tablet (4 mg total) by mouth every 8 (eight) hours as needed for nausea or vomiting. 12/07/20   Koleen Distance, MD  rizatriptan (MAXALT-MLT) 10 MG disintegrating tablet Take 1 tablet (10 mg total) by mouth as needed for migraine. May repeat in 2 hours if needed 04/02/21   Dohmeier, Porfirio Mylar, MD  tirzepatide [facility]) 2.5 MG/0.5ML Pen Inject 2.5 mg into the skin every Tuesday. 09/24/21   [provider]  tretinoin (RETIN-A) 0.025 % cream Apply 1 application topically at bedtime. 07/29/21   [provider]  venlafaxine (EFFEXOR) 75 MG tablet Take 75 mg by mouth 2 (two) times daily. 09/24/21   [provider]  SUMAtriptan (IMITREX) 50 MG tablet Take 50 mg by mouth every 2 (two) hours as needed. Patient states that the physician informed her to stop this  medication.  10/11/11  [provider]      Allergies    Tape    Review of Systems   Review of Systems  Constitutional: Negative.   HENT: Negative.    Respiratory: Negative.    Cardiovascular: Negative.   Gastrointestinal: Negative.   Genitourinary: Negative.   Skin:  Positive for wound.  Neurological: Negative.   All other systems reviewed and are negative.   Physical Exam Updated Vital Signs BP (!) 125/91 (BP Location: Right Arm)   Pulse 73   Temp 97.9 F (36.6 C) (Oral)   Resp 16   LMP 12/05/2020 (Exact Date)   SpO2 98%   Physical Exam Vitals and nursing note reviewed.  Constitutional:      General: She is not in acute distress.    Appearance: She is well-developed. She is obese. She is not ill-appearing, toxic-appearing or diaphoretic.  HENT:     Head: Normocephalic and atraumatic.  Eyes:     Pupils: Pupils are equal, round, and reactive to light.  Cardiovascular:     Rate and Rhythm: Normal rate.     Pulses: Normal pulses.     Heart sounds: Normal heart sounds.  Pulmonary:     Effort: No respiratory distress.     Breath sounds: Normal breath sounds.  Abdominal:     General: Bowel sounds are normal. There is no distension.     Palpations: Abdomen is soft.     Tenderness: There is abdominal tenderness.       Comments: Fluctuance and induration to left suprapubic region.  Mild surrounding erythema.  Does not extend into labia.  Genitourinary:    Pubic Area: Vaginal rash: left suprapubic abscess.     Comments: GU exam performed with paramedic Robin in room Musculoskeletal:        General: Normal range of motion.     Cervical back: Normal range of motion.       Legs:     Comments: Circular, 1 cm lesion to left proximal anterior thigh just inferior to inguinal crease.  Minimal surrounding erythema, no appreciable induration  Skin:    General: Skin is warm and dry.  Neurological:     General: No focal deficit present.     Mental Status: She is alert.  Psychiatric:        Mood and Affect: Mood normal.     ED Results / Procedures / Treatments   Labs (all labs ordered are listed, but only abnormal results are displayed) Labs Reviewed  BASIC METABOLIC PANEL - Abnormal; Notable for the following components:      Result Value   Potassium 3.1 (*)    CO2 21 (*)    Glucose, Bld 130 (*)    Calcium 8.7 (*)    All other components within normal limits  CBG MONITORING, ED - Abnormal; Notable for the following components:   Glucose-Capillary 157 (*)    All other components within normal limits   CBC WITH DIFFERENTIAL/PLATELET  HCG, SERUM, QUALITATIVE    EKG None  Radiology CT PELVIS W CONTRAST  Result Date: 12/18/2022 CLINICAL DATA:  Pelvic soft tissue infection EXAM: CT PELVIS WITH CONTRAST TECHNIQUE: Multidetector CT imaging of the pelvis was performed using the standard protocol following the bolus administration of intravenous contrast. RADIATION DOSE REDUCTION: This exam was performed according to the departmental dose-optimization program which includes automated exposure control, adjustment of the mA and/or kV according to patient size and/or use of iterative reconstruction technique. CONTRAST:   OMNIPAQUE IOHEXOL 300 MG/ML  SOLN COMPARISON:  10/05/2021 FINDINGS: Urinary Tract:  No abnormality visualized. Bowel: Unremarkable visualized pelvic bowel loops. Appendix normal. No free fluid within the pelvis. Vascular/Lymphatic: No pathologically enlarged lymph nodes. No significant vascular abnormality seen. Reproductive: Status post hysterectomy. Interval probable right oophorectomy. 3.6 cm simple cyst is seen within the residual left ovary, likely representing a follicular cyst in a patient of this age. No follow-up imaging is recommended for this lesion. Other: There is dermal thickening and subcutaneous soft tissue infiltration involving the anterior, proximal right thigh, best seen on image # 50/2 in keeping with local inflammatory change. No associated subcutaneous fluid collection identified in this location. Inflammatory changes also identified involving the left mons pubis extending into the left inguinal crease. In this location immediately subjacent the dermis is a 2.6 x 2.0 x 1.0 cm subcutaneous fluid collection in keeping with a subcutaneous abscess with surrounding inflammatory change. Musculoskeletal: No acute bone abnormality. Bilateral L5 pars defects are present with grade 1-2 anterolisthesis L5-S1. IMPRESSION: 1. Inflammatory changes within the left mons pubis  extending into the left inguinal crease with a 2.6 cm subcutaneous abscess within the inguinal crease. 2. Localized inflammatory change involving the anterior, proximal right thigh without associated subcutaneous fluid collection. 3. 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 4. Interval probable right oophorectomy. 3.6 cm simple cyst within the residual left ovary, likely representing a follicular cyst in a patient of this age. No follow-up imaging is recommended for this lesion. Electronically Signed   By: Helyn Numbers M.D.   On: 12/18/2022 20:32    Procedures Ultrasound ED Soft Tissue  Date/Time: 12/18/2022 9:42 PM  Performed by: Linwood Dibbles, PA-C Authorized by: Linwood Dibbles, PA-C   Procedure details:    Indications: localization of abscess and evaluate for cellulitis     Transverse view:  Visualized   Longitudinal view:  Visualized   Images: archived   Location:    Location: lower extremity     Side:  Right Findings:     abscess present    cellulitis present .Marland KitchenIncision and Drainage  Date/Time: 12/19/2022 3:58 PM  Performed by: Linwood Dibbles, PA-C Authorized by: Linwood Dibbles, PA-C   Consent:    Consent obtained:  Verbal   Consent given by:  Patient   Risks discussed:  Bleeding, incomplete drainage, pain and damage to other organs   Alternatives discussed:  No treatment Universal protocol:    Procedure explained and questions answered to patient or proxy's satisfaction: yes     Relevant documents present and verified: yes     Test results available : yes     Imaging studies available: yes     Required blood products, implants, devices, and special equipment available: yes     Site/side marked: yes     Immediately prior to procedure, a time out was called: yes     Patient identity confirmed:  Verbally with patient Location:    Type:  Abscess Pre-procedure details:    Skin preparation:  Betadine Anesthesia:    Anesthesia  method:  Local infiltration and topical application   Topical anesthetic:  LET   Local anesthetic:  Lidocaine 1% WITH epi Procedure type:    Complexity:  Complex Procedure details:    Incision types:  Single straight   Incision depth:  Subcutaneous   Wound management:  Probed and deloculated, irrigated with saline and extensive cleaning   Drainage:  Purulent   Drainage amount:  Moderate   Wound treatment:  Wound left open   Packing materials:  None Post-procedure details:    Procedure completion:  Tolerated well, no immediate complications     Medications Ordered in ED Medications  iohexol (OMNIPAQUE) 300 MG/ML solution 100 mL (100 mLs Intravenous Contrast Given 12/18/22 2012)  lidocaine (PF) (XYLOCAINE) 1 % injection 5 mL (5 mLs Infiltration Given 12/18/22 2100)  Tdap (BOOSTRIX) injection 0.5 mL (0.5 mLs Intramuscular Given 12/18/22 2101)  lidocaine-EPINEPHrine-tetracaine (LET) topical gel (3 mLs Topical Given 12/18/22 2058)  doxycycline (VIBRA-TABS) tablet 100 mg (100 mg Oral Given 12/18/22 2147)    ED Course/ Medical Decision Making/ A&P   37 year old history of hidradenitis suppurativa, uncontrolled diabetes here for evaluation of labial and proximal right thigh lesion.  Feels different than her typical hidradenitis flares.  Erythema does not extend into her perineal region.  Left labial lesion appears to be superficial with fluctuance and induration will need drainage.  Right lesion without appreciable induration.  Will plan on labs, imaging and reassess  Labs and imaging personally viewed and interpreted:  CBC without leukocytosis Preg neg BMP potassium 3.1, glucose 130 CT Pelvis abscess left mons pubis, no drainable abscess to right proximal thigh  Ultrasound performed to right thigh.  She has small, subcentimeter fluid collection.  Currently draining.  Do not feel needs incision and drainage.  See procedure note she had drainage to her left mons pubis.  Moderate purulent  drainage removed.  Discussed warm compress, antibiotics.  I have given her number to hidradenitis suppurativa clinic for her follow-up otherwise can follow-up with PCP.  At this time I have low suspicion for deep space infection, necrotizing fasciitis, forniers gangrene   The patient has been appropriately medically screened and/or stabilized in the ED. I have low suspicion for any other emergent medical condition which would require further screening, evaluation or treatment in the ED or require inpatient management.  Patient is hemodynamically stable and in no acute distress.  Patient able to ambulate in department prior to ED.  Evaluation does not show acute pathology that would require ongoing or additional emergent interventions while in the emergency department or further inpatient treatment.  I have discussed the diagnosis with the patient and answered all questions.  Pain is been managed while in the emergency department and patient has no further complaints prior to discharge.  Patient is comfortable with plan discussed in room and is stable for discharge at this time.  I have discussed strict return precautions for returning to the emergency department.  Patient was encouraged to follow-up with PCP/specialist refer to at discharge.                            Medical Decision Making Amount and/or Complexity of Data Reviewed External Data Reviewed: labs, radiology and notes. Labs: ordered. Decision-making details documented in ED Course. Radiology: ordered and independent interpretation performed. Decision-making details documented in ED Course.  Risk OTC drugs. Prescription drug management. Parenteral controlled substances. Decision regarding hospitalization. Diagnosis or treatment significantly limited by social determinants of health.     ADDENDED to add procedure note for I&D     Final Clinical Impression(s) / ED Diagnoses Final diagnoses:  Abscess  History of  hidradenitis suppurativa    Rx / DC Orders ED Discharge Orders          Ordered    doxycycline (VIBRAMYCIN) 100 MG capsule  2 times daily  12/18/22 2146              Glendola Friedhoff A, PA-C 12/18/22 2148    Rondel Baton, MD 12/19/22 0019    Charyl Bigger, Yaser Harvill A, PA-C 12/19/22 1558    Rondel Baton, MD 12/21/22 [phone]

## 2022-12-18 NOTE — Discharge Instructions (Addendum)
There is a [facility] hidradenitis suppurativa clinic.  Their contact information is listed below.  Make sure to call to schedule appointment to get established.  We have started you on antibiotics.  Take as prescribed.  Warm compress to the area.  Return for new or worsening symptoms.  [email or web address] [place]  [address] [place], [place] [address]   [phone]

## 2022-12-18 NOTE — ED Triage Notes (Signed)
Pt is here for cyst in her right groin and on left labia.  Pt has had this before but is concerned due to it being worse.

## 2023-01-09 DIAGNOSIS — R1032 Left lower quadrant pain: Secondary | ICD-10-CM | POA: Diagnosis not present

## 2023-01-09 DIAGNOSIS — R739 Hyperglycemia, unspecified: Secondary | ICD-10-CM | POA: Diagnosis not present

## 2023-01-09 DIAGNOSIS — Z90721 Acquired absence of ovaries, unilateral: Secondary | ICD-10-CM | POA: Diagnosis not present

## 2023-01-09 DIAGNOSIS — E876 Hypokalemia: Secondary | ICD-10-CM | POA: Diagnosis not present

## 2023-01-09 DIAGNOSIS — Z9079 Acquired absence of other genital organ(s): Secondary | ICD-10-CM | POA: Diagnosis not present

## 2023-01-09 DIAGNOSIS — Z90710 Acquired absence of both cervix and uterus: Secondary | ICD-10-CM | POA: Diagnosis not present

## 2023-01-09 DIAGNOSIS — N83202 Unspecified ovarian cyst, left side: Secondary | ICD-10-CM | POA: Diagnosis not present

## 2023-05-13 DIAGNOSIS — G44209 Tension-type headache, unspecified, not intractable: Secondary | ICD-10-CM | POA: Diagnosis not present

## 2023-05-13 DIAGNOSIS — Z79899 Other long term (current) drug therapy: Secondary | ICD-10-CM | POA: Diagnosis not present

## 2023-10-20 ENCOUNTER — Other Ambulatory Visit: Payer: Self-pay

## 2023-10-20 ENCOUNTER — Encounter (HOSPITAL_BASED_OUTPATIENT_CLINIC_OR_DEPARTMENT_OTHER): Payer: Self-pay

## 2023-10-20 DIAGNOSIS — Z5321 Procedure and treatment not carried out due to patient leaving prior to being seen by health care provider: Secondary | ICD-10-CM | POA: Insufficient documentation

## 2023-10-20 DIAGNOSIS — R069 Unspecified abnormalities of breathing: Secondary | ICD-10-CM | POA: Insufficient documentation

## 2023-10-20 DIAGNOSIS — Y9372 Activity, wrestling: Secondary | ICD-10-CM | POA: Insufficient documentation

## 2023-10-20 DIAGNOSIS — W228XXA Striking against or struck by other objects, initial encounter: Secondary | ICD-10-CM | POA: Insufficient documentation

## 2023-10-20 NOTE — ED Triage Notes (Signed)
 Patients children were wrestling on the bed and a foot struck patients right side of face. Reports difficulty breathing out of R nostril.

## 2023-10-21 ENCOUNTER — Emergency Department (HOSPITAL_BASED_OUTPATIENT_CLINIC_OR_DEPARTMENT_OTHER)
Admission: EM | Admit: 2023-10-21 | Discharge: 2023-10-21 | Discharge status: Against Medical Advice | Payer: Self-pay | Attending: Emergency Medicine | Admitting: Emergency Medicine

## 2023-10-21 NOTE — ED Notes (Signed)
 Pt called for room x 3 with no answer, per registration pt left

## 2024-01-18 ENCOUNTER — Emergency Department (HOSPITAL_BASED_OUTPATIENT_CLINIC_OR_DEPARTMENT_OTHER)

## 2024-01-18 ENCOUNTER — Emergency Department (HOSPITAL_BASED_OUTPATIENT_CLINIC_OR_DEPARTMENT_OTHER)
Admission: EM | Admit: 2024-01-18 | Discharge: 2024-01-18 | Disposition: A | Discharge status: Home / Self Care | Attending: Emergency Medicine | Admitting: Emergency Medicine

## 2024-01-18 ENCOUNTER — Other Ambulatory Visit: Payer: Self-pay

## 2024-01-18 ENCOUNTER — Encounter (HOSPITAL_BASED_OUTPATIENT_CLINIC_OR_DEPARTMENT_OTHER): Payer: Self-pay

## 2024-01-18 DIAGNOSIS — Z7984 Long term (current) use of oral hypoglycemic drugs: Secondary | ICD-10-CM | POA: Insufficient documentation

## 2024-01-18 DIAGNOSIS — N83202 Unspecified ovarian cyst, left side: Secondary | ICD-10-CM | POA: Insufficient documentation

## 2024-01-18 DIAGNOSIS — R1032 Left lower quadrant pain: Secondary | ICD-10-CM | POA: Diagnosis present

## 2024-01-18 DIAGNOSIS — E119 Type 2 diabetes mellitus without complications: Secondary | ICD-10-CM | POA: Diagnosis not present

## 2024-01-18 LAB — CBC WITH DIFFERENTIAL/PLATELET
Abs Immature Granulocytes: 0.04 10*3/uL (ref 0.00–0.07)
Basophils Absolute: 0 10*3/uL (ref 0.0–0.1)
Basophils Relative: 1 %
Eosinophils Absolute: 0.3 10*3/uL (ref 0.0–0.5)
Eosinophils Relative: 4 %
HCT: 41.7 % (ref 36.0–46.0)
Hemoglobin: 13.9 g/dL (ref 12.0–15.0)
Immature Granulocytes: 1 %
Lymphocytes Relative: 25 %
Lymphs Abs: 2.1 10*3/uL (ref 0.7–4.0)
MCH: 30.3 pg (ref 26.0–34.0)
MCHC: 33.3 g/dL (ref 30.0–36.0)
MCV: 91 fL (ref 80.0–100.0)
Monocytes Absolute: 0.8 10*3/uL (ref 0.1–1.0)
Monocytes Relative: 10 %
Neutro Abs: 5.1 10*3/uL (ref 1.7–7.7)
Neutrophils Relative %: 59 %
Platelets: 341 10*3/uL (ref 150–400)
RBC: 4.58 MIL/uL (ref 3.87–5.11)
RDW: 13.6 % (ref 11.5–15.5)
WBC: 8.4 10*3/uL (ref 4.0–10.5)
nRBC: 0 % (ref 0.0–0.2)

## 2024-01-18 LAB — URINALYSIS, W/ REFLEX TO CULTURE (INFECTION SUSPECTED)
Bilirubin Urine: NEGATIVE
Glucose, UA: 250 mg/dL — AB
Ketones, ur: NEGATIVE mg/dL
Leukocytes,Ua: NEGATIVE
Nitrite: NEGATIVE
Protein, ur: 100 mg/dL — AB
Specific Gravity, Urine: >1.03 — ABNORMAL HIGH (ref 1.005–1.030)
pH: 6 (ref 5.0–8.0)

## 2024-01-18 LAB — BASIC METABOLIC PANEL WITH GFR
Anion gap: 14 (ref 5–15)
BUN: 12 mg/dL (ref 6–20)
CO2: 20 mmol/L — ABNORMAL LOW (ref 22–32)
Calcium: 9 mg/dL (ref 8.9–10.3)
Chloride: 103 mmol/L (ref 98–111)
Creatinine, Ser: 0.45 mg/dL (ref 0.44–1.00)
GFR, Estimated: >60 mL/min (ref >60)
Glucose, Bld: 167 mg/dL — ABNORMAL HIGH (ref 70–99)
Potassium: 3.7 mmol/L (ref 3.5–5.1)
Sodium: 137 mmol/L (ref 135–145)

## 2024-01-18 MED ORDER — OXYCODONE-ACETAMINOPHEN 5-325 MG PO TABS: 1.0000 | ORAL_TABLET | Freq: Four times a day (QID) | ORAL | 0 refills | Status: AC | PRN

## 2024-01-18 MED ORDER — ONDANSETRON HCL 4 MG/2ML IJ SOLN
4.0000 mg | Freq: Once | INTRAMUSCULAR | Status: AC
  Administered 2024-01-18: 4 mg via INTRAVENOUS
  Filled 2024-01-18: qty 2

## 2024-01-18 MED ORDER — KETOROLAC TROMETHAMINE 30 MG/ML IJ SOLN
15.0000 mg | Freq: Once | INTRAMUSCULAR | Status: AC
  Administered 2024-01-18: 15 mg via INTRAVENOUS
  Filled 2024-01-18: qty 1

## 2024-01-18 MED ORDER — FENTANYL CITRATE PF 50 MCG/ML IJ SOSY
50.0000 ug | PREFILLED_SYRINGE | Freq: Once | INTRAMUSCULAR | Status: AC
  Administered 2024-01-18: 50 ug via INTRAVENOUS
  Filled 2024-01-18: qty 1

## 2024-01-18 NOTE — Discharge Instructions (Addendum)
 Follow-up in 2 to 3 months for recheck of an ultrasound.

## 2024-01-18 NOTE — ED Provider Notes (Signed)
 [place] EMERGENCY DEPARTMENT AT [facility] Provider Note   CSN: [id number] Arrival date & time: 01/18/24  1027     Patient presents with: Abdominal Pain   [name] is a 38 y.o. female.    Abdominal Pain Patient presents with pelvic pain.  Left lower quadrant.  Began acutely early this morning.  States it feels like when she had her right fallopian tube twisted and removed.  States they took the ovary to.  Is a previous hysterectomy.  No dysuria.  No nausea or vomiting.  No vaginal bleeding or discharge.    Past Medical History:  Diagnosis Date   Abnormal Pap smear    Anxiety    Depression    Diabetes mellitus without complication (HCC)    GERD (gastroesophageal reflux disease)    pregnancy related indigestion-tums prn   Gonorrhea    Headache(784.0)    HPV (human papilloma virus) infection    age 49   Hx of scarlet fever 08/03/1994   Migraines    PVC (premature ventricular contraction)    history of pvc's-started in early preg 5/13 saw [facility] -told to reduce caffeine  intake-pvc's have stopped    Prior to Admission medications   Medication Sig Start Date End Date Taking? Authorizing Provider  oxyCODONE -acetaminophen  (PERCOCET/ROXICET) 5-325 MG tablet Take 1 tablet by mouth every 6 (six) hours as needed for severe pain (pain score 7-10). 01/18/24  Yes Mozell Arias, MD  acetaminophen  (TYLENOL ) 500 MG tablet Take 1,000 mg by mouth every 6 (six) hours as needed.    [provider]  acetaZOLAMIDE  (DIAMOX ) 250 MG tablet Take 1 tablet (250 mg total) by mouth 2 (two) times daily. 04/02/21   Dohmeier, Raoul Byes, MD  clonazePAM (KLONOPIN) 0.5 MG tablet Take 0.5 mg by mouth at bedtime as needed for anxiety. 06/18/21   [provider]  doxycycline  (VIBRAMYCIN ) 100 MG capsule Take 1 capsule (100 mg total) by mouth 2 (two) times daily. 12/18/22   Henderly, Britni A, PA-C  FARXIGA 10 MG TABS tablet Take 10 mg by mouth daily.    [provider]  hydrOXYzine (VISTARIL) 25 MG capsule Take 25 mg by mouth 2 (two) times daily.    [provider]  ibuprofen  (ADVIL ,MOTRIN ) 200 MG tablet Take 800 mg by mouth every 6 (six) hours as needed for headache or moderate pain.    [provider]  lisinopril (ZESTRIL) 20 MG tablet Take 20 mg by mouth at bedtime.    [provider]  metFORMIN  (GLUCOPHAGE ) 500 MG tablet Take 1 tablet (500 mg total) by mouth 2 (two) times daily with a meal. Patient taking differently: Take 1,000 mg by mouth 2 (two) times daily with a meal. 09/07/21   Sueellen Emery, MD  Multiple Vitamin (MULTIVITAMIN WITH MINERALS) TABS tablet Take 1 tablet by mouth daily.    [provider]  naproxen  (NAPROSYN ) 500 MG tablet Take 1 tablet (500 mg total) by mouth 2 (two) times daily as needed for moderate pain. 10/06/21   Petrucelli, Samantha R, PA-C  omeprazole (PRILOSEC) 40 MG capsule Take 1 capsule by mouth daily.    [provider]  ondansetron  (ZOFRAN  ODT) 4 MG disintegrating tablet Take 1 tablet (4 mg total) by mouth every 8 (eight) hours as needed for nausea or vomiting. 12/07/20   Vance Gell, MD  rizatriptan  (MAXALT -MLT) 10 MG disintegrating tablet Take 1 tablet (10 mg total) by mouth as needed for migraine. May repeat in 2 hours if needed 04/02/21  Dohmeier, Raoul Byes, MD  tirzepatide (MOUNJARO) 2.5 MG/0.5ML Pen Inject 2.5 mg into the skin every Tuesday. 09/24/21   [provider]  tretinoin (RETIN-A) 0.025 % cream Apply 1 application topically at bedtime. 07/29/21   [provider]  UBRELVY 100 MG TABS Take by mouth.    [provider]  venlafaxine (EFFEXOR) 75 MG tablet Take 75 mg by mouth 2 (two) times daily. 09/24/21   [provider]  SUMAtriptan (IMITREX) 50 MG tablet Take 50 mg by mouth every 2 (two) hours as needed. Patient states that the physician informed her to stop this medication.  10/11/11  [provider]    Allergies: Tape     Review of Systems  Gastrointestinal:  Positive for abdominal pain.    Updated Vital Signs BP 131/72 (BP Location: Left Arm)   Pulse 66   Temp 98.5 F (36.9 C) (Oral)   Resp 20   Ht 5' 2 (1.575 m)   Wt 99.8 kg   LMP 12/05/2020 (Exact Date)   SpO2 97%   BMI 40.24 kg/m   Physical Exam Vitals and nursing note reviewed.  Abdominal:     Tenderness: There is abdominal tenderness.     Comments: Left lower quadrant tenderness without rebound or guarding.  No hernia palpated.   Neurological:     Mental Status: She is alert.     (all labs ordered are listed, but only abnormal results are displayed) Labs Reviewed  BASIC METABOLIC PANEL WITH GFR - Abnormal; Notable for the following components:      Result Value   CO2 20 (*)    Glucose, Bld 167 (*)    All other components within normal limits  URINALYSIS, W/ REFLEX TO CULTURE (INFECTION SUSPECTED) - Abnormal; Notable for the following components:   Specific Gravity, Urine >1.030 (*)    Glucose, UA 250 (*)    Hgb urine dipstick TRACE (*)    Protein, ur 100 (*)    Bacteria, UA FEW (*)    All other components within normal limits  CBC WITH DIFFERENTIAL/PLATELET    EKG: None  Radiology: US  PELVIC COMPLETE W TRANSVAGINAL AND TORSION R/O Result Date: 01/18/2024 CLINICAL DATA:  [id number] Left lower quadrant pain [id number] EXAM: TRANSVAGINAL ULTRASOUND OF PELVIS DOPPLER ULTRASOUND OF OVARIES TECHNIQUE: Transvaginal ultrasound examination of the pelvis was performed including evaluation of the uterus, ovaries, adnexal regions, and pelvic cul-de-sac. Color and duplex Doppler ultrasound was utilized to evaluate blood flow to the ovaries. COMPARISON:  Dec 18, 2022, January 09, 2023 FINDINGS: Uterus Hysterectomy Right ovary Surgically absent. Left ovary Measurements: 9.5 x 6.5 x 8.8 cm = volume: 282.5 mL. Cystic lesion in the left ovary measuring 4.5 x 5.9 x 5.4 cm with a hypoechoic nodular region with lace-like internal echoes along the wall,  measuring 5.2 cm. A second anechoic cyst is present measuring 5.9 x 5.2 x 6 cm. Pulsed Doppler evaluation demonstrates normal low-resistance arterial and venous waveforms in both ovaries. Other findings:  No abnormal free fluid IMPRESSION: 1. Enlarged left ovary containing 2 cysts. The larger cyst measures 5.9 x 5.2 x 6 cm. The second cyst, measuring 4.5 x 5.9 x 5.4 cm, contains a hypoechoic nodular region suggesting that this is an evolving hemorrhagic cyst. Vascular Doppler flow is present. A repeat ultrasound in 2-3 months is recommended to document resolution. 2. Hysterectomy and right oophorectomy. Electronically Signed   By: Rance Burrows M.D.   On: 01/18/2024 09:07     Procedures   Medications Ordered  in the ED  ondansetron  (ZOFRAN ) injection 4 mg (4 mg Intravenous Given 01/18/24 0748)  fentaNYL  (SUBLIMAZE ) injection 50 mcg (50 mcg Intravenous Given 01/18/24 0748)  ketorolac  (TORADOL ) 30 MG/ML injection 15 mg (15 mg Intravenous Given 01/18/24 0933)                                    Medical Decision Making Amount and/or Complexity of Data Reviewed Labs: ordered. Radiology: ordered.  Risk Prescription drug management.   Patient with abdominal/pelvic pain.  Feels like previous reported twisting of fallopian tube.  No urinary symptoms.  Has had previous hysterectomy.  Will get ultrasound to evaluate.  Also basic blood work.  Reviewed ER note from previous visit with similar symptoms.  Workup reassuring.  Blood work reassuring.  Ultrasound does show potential hemorrhagic cyst.  Likely cause of pain.  Will treat symptomatically.  Follow-up with gynecology or PCP.     Final diagnoses:  Cyst of left ovary    ED Discharge Orders          Ordered    oxyCODONE -acetaminophen  (PERCOCET/ROXICET) 5-325 MG tablet  Every 6 hours PRN        01/18/24 0942               Mozell Arias, MD 01/18/24 1443

## 2024-01-18 NOTE — ED Triage Notes (Addendum)
 Arrives POV with complaints of sharp 10/10 LLQ abdominal that started this morning. Patient reports that she had a previous occurrence of these symptoms in the past on her right side and had a twisted fallopian tube that had to be removed.

## 2024-01-18 NOTE — ED Triage Notes (Signed)
 Patient given discharge instructions. Questions were answered. Patient verbalized understanding of discharge instructions and care at home.

## 2024-05-01 ENCOUNTER — Emergency Department (HOSPITAL_BASED_OUTPATIENT_CLINIC_OR_DEPARTMENT_OTHER)

## 2024-05-01 ENCOUNTER — Encounter (HOSPITAL_BASED_OUTPATIENT_CLINIC_OR_DEPARTMENT_OTHER): Payer: Self-pay | Admitting: Urology

## 2024-05-01 ENCOUNTER — Emergency Department (HOSPITAL_BASED_OUTPATIENT_CLINIC_OR_DEPARTMENT_OTHER): Admission: EM | Admit: 2024-05-01 | Discharge: 2024-05-01 | Disposition: A | Discharge status: Home / Self Care

## 2024-05-01 ENCOUNTER — Other Ambulatory Visit: Payer: Self-pay

## 2024-05-01 DIAGNOSIS — R1032 Left lower quadrant pain: Secondary | ICD-10-CM | POA: Diagnosis present

## 2024-05-01 DIAGNOSIS — N83202 Unspecified ovarian cyst, left side: Secondary | ICD-10-CM | POA: Insufficient documentation

## 2024-05-01 LAB — HCG, SERUM, QUALITATIVE: Preg, Serum: NEGATIVE

## 2024-05-01 LAB — URINALYSIS, ROUTINE W REFLEX MICROSCOPIC
Bilirubin Urine: NEGATIVE
Glucose, UA: >1000 mg/dL — AB
Hgb urine dipstick: NEGATIVE
Leukocytes,Ua: NEGATIVE
Nitrite: NEGATIVE
Specific Gravity, Urine: 1.045 — ABNORMAL HIGH (ref 1.005–1.030)
pH: 6 (ref 5.0–8.0)

## 2024-05-01 LAB — COMPREHENSIVE METABOLIC PANEL WITH GFR
ALT: 21 U/L (ref 0–44)
AST: 21 U/L (ref 15–41)
Albumin: 4 g/dL (ref 3.5–5.0)
Alkaline Phosphatase: 90 U/L (ref 38–126)
Anion gap: 14 (ref 5–15)
BUN: 11 mg/dL (ref 6–20)
CO2: 23 mmol/L (ref 22–32)
Calcium: 9.5 mg/dL (ref 8.9–10.3)
Chloride: 104 mmol/L (ref 98–111)
Creatinine, Ser: 0.54 mg/dL (ref 0.44–1.00)
GFR, Estimated: >60 mL/min (ref >60)
Glucose, Bld: 116 mg/dL — ABNORMAL HIGH (ref 70–99)
Potassium: 3.8 mmol/L (ref 3.5–5.1)
Sodium: 141 mmol/L (ref 135–145)
Total Bilirubin: <0.2 mg/dL (ref 0.0–1.2)
Total Protein: 6.9 g/dL (ref 6.5–8.1)

## 2024-05-01 LAB — LIPASE, BLOOD: Lipase: 87 U/L — ABNORMAL HIGH (ref 11–51)

## 2024-05-01 LAB — CBC
HCT: 42.3 % (ref 36.0–46.0)
Hemoglobin: 14.4 g/dL (ref 12.0–15.0)
MCH: 30.6 pg (ref 26.0–34.0)
MCHC: 34 g/dL (ref 30.0–36.0)
MCV: 89.8 fL (ref 80.0–100.0)
Platelets: 335 K/uL (ref 150–400)
RBC: 4.71 MIL/uL (ref 3.87–5.11)
RDW: 13.9 % (ref 11.5–15.5)
WBC: 9.2 K/uL (ref 4.0–10.5)
nRBC: 0 % (ref 0.0–0.2)

## 2024-05-01 MED ORDER — MORPHINE SULFATE (PF) 4 MG/ML IV SOLN
4.0000 mg | Freq: Once | INTRAVENOUS | Status: AC
  Administered 2024-05-01: 4 mg via INTRAVENOUS
  Filled 2024-05-01: qty 1

## 2024-05-01 MED ORDER — LACTATED RINGERS IV BOLUS
1000.0000 mL | Freq: Once | INTRAVENOUS | Status: AC
  Administered 2024-05-01: 1000 mL via INTRAVENOUS

## 2024-05-01 NOTE — ED Triage Notes (Signed)
 Pt states approx 2 hours ago had sharp lower left pelvic pain  H/o ovarian cyst and ovarian tortion  States feels the same as the last tortion  Frequency of urination. Bladder cramping with urination as well

## 2024-05-01 NOTE — ED Provider Notes (Signed)
 [place] EMERGENCY DEPARTMENT AT [facility] Provider Note   CSN: [id number] Arrival date & time: 05/01/24  1856     Patient presents with: Pelvic Pain   [name] is a 38 y.o. female.   38 year old female presenting emergency department from abrupt onset left lower quadrant abdominal pain similar to prior portions.  No nausea no vomiting.  Has had some frequency, but no nausea no vomiting.  No flank pain.  She is concerned that she has another torsion   Pelvic Pain       Prior to Admission medications   Medication Sig Start Date End Date Taking? Authorizing Provider  acetaminophen  (TYLENOL ) 500 MG tablet Take 1,000 mg by mouth every 6 (six) hours as needed.    [provider]  acetaZOLAMIDE  (DIAMOX ) 250 MG tablet Take 1 tablet (250 mg total) by mouth 2 (two) times daily. 04/02/21   Dohmeier, Dedra, MD  clonazePAM (KLONOPIN) 0.5 MG tablet Take 0.5 mg by mouth at bedtime as needed for anxiety. 06/18/21   [provider]  doxycycline  (VIBRAMYCIN ) 100 MG capsule Take 1 capsule (100 mg total) by mouth 2 (two) times daily. 12/18/22   Henderly, Britni A, PA-C  FARXIGA 10 MG TABS tablet Take 10 mg by mouth daily.    [provider]  hydrOXYzine (VISTARIL) 25 MG capsule Take 25 mg by mouth 2 (two) times daily.    [provider]  ibuprofen  (ADVIL ,MOTRIN ) 200 MG tablet Take 800 mg by mouth every 6 (six) hours as needed for headache or moderate pain.    [provider]  lisinopril (ZESTRIL) 20 MG tablet Take 20 mg by mouth at bedtime.    [provider]  metFORMIN  (GLUCOPHAGE ) 500 MG tablet Take 1 tablet (500 mg total) by mouth 2 (two) times daily with a meal. Patient taking differently: Take 1,000 mg by mouth 2 (two) times daily with a meal. 09/07/21   Dean Clarity, MD  Multiple Vitamin (MULTIVITAMIN WITH MINERALS) TABS tablet Take 1 tablet by mouth daily.    [provider]  naproxen  (NAPROSYN ) 500 MG tablet  Take 1 tablet (500 mg total) by mouth 2 (two) times daily as needed for moderate pain. 10/06/21   Petrucelli, Samantha R, PA-C  omeprazole (PRILOSEC) 40 MG capsule Take 1 capsule by mouth daily.    [provider]  ondansetron  (ZOFRAN  ODT) 4 MG disintegrating tablet Take 1 tablet (4 mg total) by mouth every 8 (eight) hours as needed for nausea or vomiting. 12/07/20   Brien Therisa MATSU, MD  oxyCODONE -acetaminophen  (PERCOCET/ROXICET) 5-325 MG tablet Take 1 tablet by mouth every 6 (six) hours as needed for severe pain (pain score 7-10). 01/18/24   Patsey Lot, MD  rizatriptan  (MAXALT -MLT) 10 MG disintegrating tablet Take 1 tablet (10 mg total) by mouth as needed for migraine. May repeat in 2 hours if needed 04/02/21   Dohmeier, Dedra, MD  tirzepatide (MOUNJARO) 2.5 MG/0.5ML Pen Inject 2.5 mg into the skin every Tuesday. 09/24/21   [provider]  tretinoin (RETIN-A) 0.025 % cream Apply 1 application topically at bedtime. 07/29/21   [provider]  UBRELVY 100 MG TABS Take by mouth.    [provider]  venlafaxine (EFFEXOR) 75 MG tablet Take 75 mg by mouth 2 (two) times daily. 09/24/21   [provider]  SUMAtriptan (IMITREX) 50 MG tablet Take 50 mg by mouth every 2 (two) hours as needed. Patient states that the physician informed her to stop this medication.  10/11/11  [provider]    Allergies: Tape    Review of Systems  Genitourinary:  Positive for pelvic pain.    Updated Vital Signs BP 128/74   Pulse 74   Temp 97.6 F (36.4 C)   Resp 16   Ht 5' 2 (1.575 m)   Wt 99.8 kg   LMP 12/05/2020 (Exact Date)   SpO2 97%   BMI 40.24 kg/m   Physical Exam Vitals and nursing note reviewed.  Constitutional:      General: She is not in acute distress.    Appearance: She is obese. She is not toxic-appearing.  HENT:     Mouth/Throat:     Mouth: Mucous membranes are moist.  Eyes:     Conjunctiva/sclera: Conjunctivae normal.  Cardiovascular:      Rate and Rhythm: Normal rate and regular rhythm.  Pulmonary:     Effort: Pulmonary effort is normal.  Abdominal:     General: Abdomen is flat. There is no distension.     Tenderness: There is no abdominal tenderness. There is no guarding or rebound.  Musculoskeletal:        General: Normal range of motion.  Skin:    General: Skin is warm and dry.     Capillary Refill: Capillary refill takes less than 2 seconds.  Neurological:     Mental Status: She is alert and oriented to person, place, and time.  Psychiatric:        Mood and Affect: Mood normal.        Behavior: Behavior normal.     (all labs ordered are listed, but only abnormal results are displayed) Labs Reviewed  URINALYSIS, ROUTINE W REFLEX MICROSCOPIC - Abnormal; Notable for the following components:      Result Value   Specific Gravity, Urine 1.045 (*)    Glucose, UA >1,000 (*)    Ketones, ur TRACE (*)    Protein, ur TRACE (*)    Bacteria, UA RARE (*)    All other components within normal limits  COMPREHENSIVE METABOLIC PANEL WITH GFR - Abnormal; Notable for the following components:   Glucose, Bld 116 (*)    All other components within normal limits  LIPASE, BLOOD - Abnormal; Notable for the following components:   Lipase 87 (*)    All other components within normal limits  CBC  HCG, SERUM, QUALITATIVE    EKG: None  Radiology: US  PELVIC COMPLETE W TRANSVAGINAL AND TORSION R/O Result Date: 05/01/2024 CLINICAL DATA:  Prior hysterectomy and right oophorectomy presenting with sharp left-sided pelvic pain. EXAM: TRANSABDOMINAL AND TRANSVAGINAL ULTRASOUND OF PELVIS DOPPLER ULTRASOUND OF OVARIES TECHNIQUE: Both transabdominal and transvaginal ultrasound examinations of the pelvis were performed. Transabdominal technique was performed for global imaging of the pelvis including uterus, ovaries, adnexal regions, and pelvic cul-de-sac. It was necessary to proceed with endovaginal exam following the transabdominal exam  to visualize the left ovary and bilateral adnexa. Color and duplex Doppler ultrasound was utilized to evaluate blood flow to the ovaries. COMPARISON:  January 18, 2024 FINDINGS: Uterus The uterus is surgically absent. Endometrium Thickness: N/A. Right ovary The right ovary is surgically absent. Left ovary Measurements: 9.4 cm x 5.8 cm x 3.6 cm = volume: 102.8 mL. Multiple simple left ovarian cysts are seen. The largest measures 4.7 cm x 3.7 cm x 4.5 cm. These are seen on the prior study. A 1.8 cm x 1.6 cm x 1.5 cm heterogeneous hypoechoic area is also noted. This area is not clearly identified on the prior  study. No abnormal flow is seen within this region on color Doppler evaluation. Pulsed Doppler evaluation of the LEFT ovary demonstrates normal low-resistance arterial and venous waveforms. Other findings No abnormal free fluid. IMPRESSION: 1. Findings consistent with prior hysterectomy and right oophorectomy. 2. Large simple left ovarian cysts. 3. Complex heterogeneous echogenic lesion within the left ovary which may represent remnants of the large involving hemorrhagic cyst seen on the prior study. Nonemergent MRI correlation is recommended for improved characterization. Electronically Signed   By: Suzen Dials M.D.   On: 05/01/2024 22:53     Procedures   Medications Ordered in the ED  morphine  (PF) 4 MG/ML injection 4 mg (4 mg Intravenous Given 05/01/24 2118)  lactated ringers  bolus 1,000 mL (0 mLs Intravenous Stopped 05/01/24 2247)    Clinical Course as of 05/01/24 2305  Mon May 01, 2024  2258 US  PELVIC COMPLETE W TRANSVAGINAL AND TORSION R/O IMPRESSION: 1. Findings consistent with prior hysterectomy and right oophorectomy. 2. Large simple left ovarian cysts. 3. Complex heterogeneous echogenic lesion within the left ovary which may represent remnants of the large involving hemorrhagic cyst seen on the prior study. Nonemergent MRI correlation is recommended for improved  characterization.   Electronically Signed   By: Suzen Dials M.D.   On: 05/01/2024 22:53   [TY]  2301 Patient is feeling improved after pain medications.  Workup reassuring.  No leukocytosis to suggest systemic infection.  No anemia.  Comprehensive panel normal.  Normal kidney function.  No transaminitis suggest hepatobiliary disease.  Does have a minor elevation in her lipase, but no epigastric tenderness.  UA without overt infection.  Pregnancy test negative.  Ectopic unlikely.  Ultrasound pelvis without torsion, does have cysts, did discuss radiology's recommendation for outpatient MRI.  Patient feels that she can follow-up with her primary doctor and her OB/GYN.  Will discharge in stable condition. [TY]    Clinical Course User Index [TY] Neysa Caron PARAS, DO                                 Medical Decision Making This is a 38 year old female presenting emergency department for left lower abdominal pain.  Complex past medical history to include morbid obesity, depression anxiety, diabetes, prior ovarian torsion.  She is afebrile nontachycardic normotensive.  Minor tenderness to her left lower quadrant on exam but overall well-appearing.  Concern for torsion will get screening labs and ultrasound.  See ED course for further MDM and final disposition.  Will give fentanyl  for pain  Amount and/or Complexity of Data Reviewed Independent Historian:     Details: Family member notes patient with prior torsion External Data Reviewed:     Details: Last ultrasound with multiple cysts likely hemorrhagic cyst to left ovary Labs: ordered. Decision-making details documented in ED Course. Radiology: independent interpretation performed. Decision-making details documented in ED Course.    Details: Appears to have color-flow  Risk Prescription drug management. Parenteral controlled substances. Decision regarding hospitalization. Diagnosis or treatment significantly limited by social determinants  of health.       Final diagnoses:  None    ED Discharge Orders     None          Neysa Caron PARAS, DO 05/01/24 2305

## 2024-05-01 NOTE — Discharge Instructions (Signed)
 As discussed please follow-up with your primary doctor and your OB/GYN.  The radiologist is recommending you get a nonemergent MRI of your pelvis to further evaluate the cysts on your left ovary.  Return if felt fevers, chills, severe pain, inability eat or drink due to nausea vomiting or he develop any new or worsening symptoms that are concerning to you.
